# Patient Record
Sex: Female | Born: 1985 | Hispanic: Refuse to answer | Marital: Single
Health system: Northeastern US, Academic
[De-identification: ages and names within clinical notes are randomized; demographics above are authoritative.]

## PROBLEM LIST (undated history)

## (undated) ENCOUNTER — Inpatient Hospital Stay (HOSPITAL_COMMUNITY): Payer: Self-pay

## (undated) HISTORY — PX: TONSILLECTOMY: SUR1361

## (undated) HISTORY — PX: BREAST SURGERY: SHX581

---
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
## 2010-11-19 ENCOUNTER — Inpatient Hospital Stay (HOSPITAL_COMMUNITY)
Admission: AD | Admit: 2010-11-19 | Discharge: 2010-11-20 | Payer: Self-pay | Source: Home / Self Care | Attending: Obstetrics and Gynecology | Admitting: Obstetrics and Gynecology

## 2011-02-11 LAB — CBC
HCT: 24.6 % — ABNORMAL LOW (ref 36.0–46.0)
HCT: 32.2 % — ABNORMAL LOW (ref 36.0–46.0)
Hemoglobin: 10.7 g/dL — ABNORMAL LOW (ref 12.0–15.0)
Hemoglobin: 8.3 g/dL — ABNORMAL LOW (ref 12.0–15.0)
MCH: 29.6 pg (ref 26.0–34.0)
RBC: 2.77 MIL/uL — ABNORMAL LOW (ref 3.87–5.11)
RBC: 3.61 MIL/uL — ABNORMAL LOW (ref 3.87–5.11)

## 2011-02-11 LAB — RPR: RPR Ser Ql: NONREACTIVE

## 2011-02-11 LAB — CCBB MATERNAL DONOR DRAW

## 2011-04-19 NOTE — Op Note (Signed)
   [name], Beth Lowe                       ACCOUNT NO.:  [id number]   MEDICAL RECORD NO.:  [id number]                   PATIENT TYPE:  AMB   LOCATION:  DSC                                  FACILITY:  [facility]   PHYSICIAN:  Kathy Breach, M.D.                   DATE OF BIRTH:  1986-12-01   DATE OF PROCEDURE:  11/22/2002  DATE OF DISCHARGE:                                 OPERATIVE REPORT   PREOPERATIVE DIAGNOSIS:  Chronic recurring adenotonsillitis.   POSTOPERATIVE DIAGNOSIS:  Chronic recurring adenotonsillitis.   OPERATION:  Adenotonsillectomy.   DESCRIPTION OF PROCEDURE:  With the patient under general orotracheal  anesthesia, the Crowe-Davis mouth gag was inserted and the patient was  placed in the Rose position. The tonsils were 1 to 2+ enlarged and deeply  cryptic. The soft palate was normal to configuration. The hard palate was  intact to palpation. The tonsils were not pulsatile to palpation.   The red rubber catheter was passed through the left nasal chamber and was  used to elevate the soft palate. Moderate nasopharyngeal adenoid tissue was  present. The adenoids were removed by curretage. Packs were placed for  hemostasis.   The left tonsil was grasped at the superior pole, but the tonsil was  extremely friable and difficult to dissect but with electrodissection was  dissected from the fossa, maintaining complete hemostasis with  electrocautery. The right tonsil was removed in a similar fashion.   The packs were removed from the nasopharynx, and under mirrored  visualization, ablation of the remaining adenoidal fragments in the  posterior choana and the Rosenmuller's fossa areas was ablated, as well as  obtaining complete hemostasis at the adenoidectomy site. Blood loss from the  procedure was estimated at 150 cc.   The patient tolerated the procedure well. She was transferred to the  recovery room in stable general condition.                 Kathy Breach, M.D.    Venia Minks  D:  11/22/2002  T:  11/22/2002  Job:  [id number]

## 2013-11-03 ENCOUNTER — Inpatient Hospital Stay (HOSPITAL_COMMUNITY)
Admission: AD | Admit: 2013-11-03 | Discharge: 2013-11-04 | Disposition: A | Discharge status: Home / Self Care | Payer: Medicaid Other | Source: Ambulatory Visit | Attending: Obstetrics and Gynecology | Admitting: Obstetrics and Gynecology

## 2013-11-03 DIAGNOSIS — O418X10 Other specified disorders of amniotic fluid and membranes, first trimester, not applicable or unspecified: Secondary | ICD-10-CM

## 2013-11-03 DIAGNOSIS — R109 Unspecified abdominal pain: Secondary | ICD-10-CM | POA: Insufficient documentation

## 2013-11-03 DIAGNOSIS — O208 Other hemorrhage in early pregnancy: Secondary | ICD-10-CM | POA: Insufficient documentation

## 2013-11-03 DIAGNOSIS — O2620 Pregnancy care for patient with recurrent pregnancy loss, unspecified trimester: Secondary | ICD-10-CM | POA: Insufficient documentation

## 2013-11-03 DIAGNOSIS — O2621 Pregnancy care for patient with recurrent pregnancy loss, first trimester: Secondary | ICD-10-CM

## 2013-11-03 DIAGNOSIS — O2390 Unspecified genitourinary tract infection in pregnancy, unspecified trimester: Secondary | ICD-10-CM | POA: Insufficient documentation

## 2013-11-03 DIAGNOSIS — B373 Candidiasis of vulva and vagina: Secondary | ICD-10-CM | POA: Insufficient documentation

## 2013-11-03 DIAGNOSIS — B3731 Acute candidiasis of vulva and vagina: Secondary | ICD-10-CM | POA: Insufficient documentation

## 2013-11-04 ENCOUNTER — Encounter (HOSPITAL_COMMUNITY): Payer: Self-pay

## 2013-11-04 ENCOUNTER — Inpatient Hospital Stay (HOSPITAL_COMMUNITY): Payer: Medicaid Other

## 2013-11-04 DIAGNOSIS — O468X9 Other antepartum hemorrhage, unspecified trimester: Secondary | ICD-10-CM

## 2013-11-04 LAB — WET PREP, GENITAL
Trich, Wet Prep: NONE SEEN
Yeast Wet Prep HPF POC: NONE SEEN

## 2013-11-04 LAB — URINALYSIS, ROUTINE W REFLEX MICROSCOPIC
Glucose, UA: NEGATIVE mg/dL
Leukocytes, UA: NEGATIVE
pH: 6.5 (ref 5.0–8.0)

## 2013-11-04 MED ORDER — TERCONAZOLE 0.4 % VA CREA: 1.0000 | TOPICAL_CREAM | Freq: Every day | VAGINAL | Status: DC

## 2013-11-04 NOTE — MAU Provider Note (Signed)
Chief Complaint: Abdominal Pain and Vaginal Bleeding   First Provider Initiated Contact with Patient 11/04/13 0028      SUBJECTIVE HPI: Beth Lowe is a 27 y.o. G3P2002 at 8.4 by LMP who presents from [facility] with low abd pain and scant spotting for R/O ectopic pregnancy. Quant 200,000. Pelvic done, but unsure if cultures done. [place] not avail. See note scanned under media tab. O Negative.  Denies fever, chills, vaginal discharge, GI complaints, urinary complaints  History reviewed. No pertinent past medical history. OB History  Gravida Para Term Preterm AB SAB TAB Ectopic Multiple Living  3 2 2       2     # Outcome Date GA Lbr Len/2nd Weight Sex Delivery Anes PTL Lv  3 CUR           2 TRM      SVD   Y  1 TRM      SVD   Y     Past Surgical History  Procedure Laterality Date  . Tonsillectomy    . Breast surgery     History   Social History  . Marital Status: Single    Spouse Name: N/A    Number of Children: N/A  . Years of Education: N/A   Occupational History  . Not on file.   Social History Main Topics  . Smoking status: Never Smoker   . Smokeless tobacco: Never Used  . Alcohol Use: No  . Drug Use: No  . Sexual Activity: Yes   Other Topics Concern  . Not on file   Social History Narrative  . No narrative on file   No current facility-administered medications on file prior to encounter.   No current outpatient prescriptions on file prior to encounter.   No Known Allergies  ROS: Pertinent items in HPI  OBJECTIVE Blood pressure 123/72, pulse 79, temperature 98.7 F (37.1 C), temperature source Oral, resp. rate 18, height 5\' 9"  (1.753 m), weight 69.4 kg (153 lb), last menstrual period 09/05/2013, SpO2 100.00%. GENERAL: Well-developed, well-nourished female in no acute distress.  HEENT: Normocephalic HEART: normal rate RESP: normal effort ABDOMEN: Soft, non-tender EXTREMITIES: Nontender, no edema NEURO: Alert and oriented SPECULUM EXAM:  NEFG, light tan discharge, normal odor. scant, cervix clean BIMANUAL: cervix closed; uterus 8-week size, no adnexal tenderness or masses. No cervical motion tenderness.  LAB RESULTS Results for orders placed during the hospital encounter of 11/03/13 (from the past 24 hour(s))  URINALYSIS, ROUTINE W REFLEX MICROSCOPIC     Status: None   Collection Time    11/04/13 12:10 AM      Result Value Range   Color, Urine YELLOW  YELLOW   APPearance CLEAR  CLEAR   Specific Gravity, Urine 1.020  1.005 - 1.030   pH 6.5  5.0 - 8.0   Glucose, UA NEGATIVE  NEGATIVE mg/dL   Hgb urine dipstick NEGATIVE  NEGATIVE   Bilirubin Urine NEGATIVE  NEGATIVE   Ketones, ur NEGATIVE  NEGATIVE mg/dL   Protein, ur NEGATIVE  NEGATIVE mg/dL   Urobilinogen, UA 0.2  0.0 - 1.0 mg/dL   Nitrite NEGATIVE  NEGATIVE   Leukocytes, UA NEGATIVE  NEGATIVE  WET PREP, GENITAL     Status: Abnormal   Collection Time    11/04/13  2:10 AM      Result Value Range   Yeast Wet Prep HPF POC NONE SEEN  NONE SEEN   Trich, Wet Prep NONE SEEN  NONE SEEN   Clue Cells  Wet Prep HPF POC FEW (*) NONE SEEN   WBC, Wet Prep HPF POC MODERATE (*) NONE SEEN    IMAGING US Ob Comp Less 14 Wks  11/04/2013   CLINICAL DATA:  Right lower quadrant pain and spotting. Eight weeks 4 days pregnant by last menstrual period.  EXAM: OBSTETRIC <14 WK ULTRASOUND  TECHNIQUE: Transabdominal ultrasound was performed for evaluation of the gestation as well as the maternal uterus and adnexal regions.  COMPARISON:  04/18/2010  FINDINGS: Intrauterine gestational sac: Visualized/normal in shape.  Yolk sac:  Present  Embryo:  Present  Cardiac Activity: Present  Heart Rate: 165 bpm  CRL:   21  mm   8 w 5 d                  [place] EDC: 06/11/2014  Maternal uterus/adnexae: Right ovary not visualized. 2.6 cm left ovarian lesion with peripheral hyperemia is suspicious for a corpus luteal cyst  Tiny subchorionic hemorrhage is identified, including on image 2.  No significant free fluid.   IMPRESSION: 1. Intrauterine pregnancy of 8 weeks 5 days with fetal heart rate of 165 beats per min. 2. Tiny subchorionic hemorrhage. 3. Left ovarian corpus luteal cyst.   Electronically Signed   By: Jeronimo Greaves M.D.   On: 11/04/2013 01:04    MAU COURSE  ASSESSMENT 1. Subchorionic hemorrhage in first trimester . Live single intrauterine pregnancy, size equals dates.   2. Three previous miscarriages, affecting care of mother, antepartum, first trimester   3. Vulvovaginal candidiasis    PLAN Discharge home in stable condition. Bleeding precautions. Pelvic rest x1 week. Rhophylac deferred due to almost imperceptible bleeding. Follow-up Information   Follow up with [facility] (Start prenatal care)    Contact information:   [address] [place] [address] [phone]      Follow up with [facility]. (As needed for OB/GYN emergencies)    Contact information:   [address] [id number] [place] [address] [phone]       Medication List         multivitamin-prenatal 27-0.8 MG Tabs tablet  Take 1 tablet by mouth daily at 12 noon.     terconazole 0.4 % vaginal cream  Commonly known as:  TERAZOL 7  Place 1 applicator vaginally at bedtime.       [place], CNM 11/04/2013  2:51 AM

## 2013-11-04 NOTE — MAU Note (Signed)
Pt states right lower abdominal pain and spotting began about 2 weeks ago. Pt was transferred via private vehicle from [facility] for ultrasound. Pt brought medical records.

## 2013-11-05 NOTE — MAU Provider Note (Signed)
Attestation of Attending Supervision of Advanced Practitioner: Evaluation and management procedures were performed by the PA/NP/CNM/OB Fellow under my supervision/collaboration. Chart reviewed and agree with management and plan.  Deardra Hinkley V 11/05/2013 10:31 PM

## 2013-12-02 NOTE — L&D Delivery Note (Signed)
Patient was C/C/+3 and pushed for 2 minutes with epidural.   NSVD  female infant, Apgars 8,9, weight P.   A less than 30 second shoulder dystocia was resolved with McRoberts and suprapubic pressure. The patient had one small second degree R labial laceration repaired with 3-0 vicryl R. Fundus was firm. EBL was expected. Placenta was delivered intact. Vagina was clear.  Baby was vigorous and doing skin to skin with mother and moving both arms actively.  Royale Swamy A

## 2014-05-13 LAB — OB RESULTS CONSOLE GBS: STREP GROUP B AG: NEGATIVE

## 2014-05-25 ENCOUNTER — Encounter (HOSPITAL_COMMUNITY): Payer: Self-pay | Admitting: *Deleted

## 2014-05-25 ENCOUNTER — Inpatient Hospital Stay (HOSPITAL_COMMUNITY)
Admission: AD | Admit: 2014-05-25 | Discharge: 2014-05-25 | Disposition: A | Discharge status: Home / Self Care | Payer: Medicaid Other | Source: Ambulatory Visit | Attending: Obstetrics and Gynecology | Admitting: Obstetrics and Gynecology

## 2014-05-25 DIAGNOSIS — O479 False labor, unspecified: Secondary | ICD-10-CM | POA: Insufficient documentation

## 2014-05-25 MED ORDER — ZOLPIDEM TARTRATE 5 MG PO TABS
5.0000 mg | ORAL_TABLET | Freq: Once | ORAL | Status: AC
  Administered 2014-05-25: 5 mg via ORAL
  Filled 2014-05-25: qty 1

## 2014-05-25 NOTE — MAU Note (Signed)
PT SAYS SHE STARTED HURTING BAD A T  2300.   VE IN OFFICE YESTERDAY   2 CM.  DENIES HSV AND  MRSA.Marland Kitchen.  GBS-  NEG

## 2014-06-08 ENCOUNTER — Encounter (HOSPITAL_COMMUNITY): Payer: Medicaid Other | Admitting: Anesthesiology

## 2014-06-08 ENCOUNTER — Inpatient Hospital Stay (HOSPITAL_COMMUNITY): Payer: Medicaid Other | Admitting: Anesthesiology

## 2014-06-08 ENCOUNTER — Inpatient Hospital Stay (HOSPITAL_COMMUNITY)
Admission: AD | Admit: 2014-06-08 | Discharge: 2014-06-10 | DRG: 775 | Disposition: A | Discharge status: Home / Self Care | Payer: Medicaid Other | Source: Ambulatory Visit | Attending: Obstetrics and Gynecology | Admitting: Obstetrics and Gynecology

## 2014-06-08 ENCOUNTER — Encounter (HOSPITAL_COMMUNITY): Payer: Self-pay | Admitting: *Deleted

## 2014-06-08 DIAGNOSIS — O479 False labor, unspecified: Secondary | ICD-10-CM | POA: Diagnosis present

## 2014-06-08 DIAGNOSIS — IMO0001 Reserved for inherently not codable concepts without codable children: Secondary | ICD-10-CM

## 2014-06-08 LAB — OB RESULTS CONSOLE GC/CHLAMYDIA
Chlamydia: NEGATIVE
Gonorrhea: NEGATIVE

## 2014-06-08 LAB — OB RESULTS CONSOLE ANTIBODY SCREEN: Antibody Screen: NEGATIVE

## 2014-06-08 LAB — CBC
HEMATOCRIT: 33.2 % — AB (ref 36.0–46.0)
Hemoglobin: 11 g/dL — ABNORMAL LOW (ref 12.0–15.0)
MCH: 28.9 pg (ref 26.0–34.0)
MCHC: 33.1 g/dL (ref 30.0–36.0)
MCV: 87.4 fL (ref 78.0–100.0)
PLATELETS: 213 10*3/uL (ref 150–400)
RBC: 3.8 MIL/uL — ABNORMAL LOW (ref 3.87–5.11)
RDW: 13.3 % (ref 11.5–15.5)
WBC: 9 10*3/uL (ref 4.0–10.5)

## 2014-06-08 LAB — OB RESULTS CONSOLE ABO/RH: RH TYPE: NEGATIVE

## 2014-06-08 LAB — OB RESULTS CONSOLE HIV ANTIBODY (ROUTINE TESTING): HIV: NONREACTIVE

## 2014-06-08 LAB — OB RESULTS CONSOLE RUBELLA ANTIBODY, IGM: Rubella: IMMUNE

## 2014-06-08 LAB — OB RESULTS CONSOLE HEPATITIS B SURFACE ANTIGEN: Hepatitis B Surface Ag: NEGATIVE

## 2014-06-08 LAB — OB RESULTS CONSOLE RPR: RPR: NONREACTIVE

## 2014-06-08 MED ORDER — FLEET ENEMA 7-19 GM/118ML RE ENEM: 1.0000 | ENEMA | RECTAL | Status: DC | PRN

## 2014-06-08 MED ORDER — ONDANSETRON HCL 4 MG/2ML IJ SOLN: 4.0000 mg | Freq: Four times a day (QID) | INTRAMUSCULAR | Status: DC | PRN

## 2014-06-08 MED ORDER — OXYTOCIN 40 UNITS IN LACTATED RINGERS INFUSION - SIMPLE MED
62.5000 mL/h | INTRAVENOUS | Status: DC
  Filled 2014-06-08 (×2): qty 1000

## 2014-06-08 MED ORDER — LACTATED RINGERS IV SOLN: 500.0000 mL | INTRAVENOUS | Status: DC | PRN

## 2014-06-08 MED ORDER — CITRIC ACID-SODIUM CITRATE 334-500 MG/5ML PO SOLN: 30.0000 mL | ORAL | Status: DC | PRN

## 2014-06-08 MED ORDER — LIDOCAINE HCL (PF) 1 % IJ SOLN
30.0000 mL | INTRAMUSCULAR | Status: DC | PRN
  Filled 2014-06-08: qty 30

## 2014-06-08 MED ORDER — FENTANYL 2.5 MCG/ML BUPIVACAINE 1/10 % EPIDURAL INFUSION (WH - ANES)
INTRAMUSCULAR | Status: DC | PRN
  Administered 2014-06-08: 14 mL/h via EPIDURAL

## 2014-06-08 MED ORDER — PHENYLEPHRINE 40 MCG/ML (10ML) SYRINGE FOR IV PUSH (FOR BLOOD PRESSURE SUPPORT)
80.0000 ug | PREFILLED_SYRINGE | INTRAVENOUS | Status: DC | PRN
  Filled 2014-06-08: qty 2

## 2014-06-08 MED ORDER — IBUPROFEN 600 MG PO TABS: 600.0000 mg | ORAL_TABLET | Freq: Four times a day (QID) | ORAL | Status: DC | PRN

## 2014-06-08 MED ORDER — FENTANYL 2.5 MCG/ML BUPIVACAINE 1/10 % EPIDURAL INFUSION (WH - ANES)
14.0000 mL/h | INTRAMUSCULAR | Status: DC | PRN
Start: 2014-06-08 — End: 2014-06-09
  Administered 2014-06-08: 14 mL/h via EPIDURAL
  Filled 2014-06-08: qty 125

## 2014-06-08 MED ORDER — LACTATED RINGERS IV SOLN
INTRAVENOUS | Status: DC
  Administered 2014-06-08: 17:00:00 via INTRAVENOUS

## 2014-06-08 MED ORDER — OXYCODONE-ACETAMINOPHEN 5-325 MG PO TABS: 1.0000 | ORAL_TABLET | ORAL | Status: DC | PRN

## 2014-06-08 MED ORDER — EPHEDRINE 5 MG/ML INJ
10.0000 mg | INTRAVENOUS | Status: DC | PRN
  Filled 2014-06-08: qty 2

## 2014-06-08 MED ORDER — TERBUTALINE SULFATE 1 MG/ML IJ SOLN: 0.2500 mg | Freq: Once | INTRAMUSCULAR | Status: AC | PRN

## 2014-06-08 MED ORDER — ACETAMINOPHEN 325 MG PO TABS: 650.0000 mg | ORAL_TABLET | ORAL | Status: DC | PRN

## 2014-06-08 MED ORDER — PHENYLEPHRINE 40 MCG/ML (10ML) SYRINGE FOR IV PUSH (FOR BLOOD PRESSURE SUPPORT)
80.0000 ug | PREFILLED_SYRINGE | INTRAVENOUS | Status: DC | PRN
  Filled 2014-06-08: qty 2
  Filled 2014-06-08: qty 10

## 2014-06-08 MED ORDER — LACTATED RINGERS IV SOLN
500.0000 mL | Freq: Once | INTRAVENOUS | Status: AC
Start: 2014-06-08 — End: 2014-06-08
  Administered 2014-06-08: 500 mL via INTRAVENOUS

## 2014-06-08 MED ORDER — OXYTOCIN BOLUS FROM INFUSION
500.0000 mL | INTRAVENOUS | Status: DC
  Administered 2014-06-09: 500 mL via INTRAVENOUS

## 2014-06-08 MED ORDER — BUTORPHANOL TARTRATE 1 MG/ML IJ SOLN: 1.0000 mg | INTRAMUSCULAR | Status: DC | PRN

## 2014-06-08 MED ORDER — DIPHENHYDRAMINE HCL 50 MG/ML IJ SOLN: 12.5000 mg | INTRAMUSCULAR | Status: DC | PRN

## 2014-06-08 MED ORDER — OXYTOCIN 40 UNITS IN LACTATED RINGERS INFUSION - SIMPLE MED
1.0000 m[IU]/min | INTRAVENOUS | Status: DC
  Administered 2014-06-08: 1 m[IU]/min via INTRAVENOUS

## 2014-06-08 MED ORDER — LIDOCAINE HCL (PF) 1 % IJ SOLN
INTRAMUSCULAR | Status: DC | PRN
  Administered 2014-06-08: 8 mL
  Administered 2014-06-08: 9 mL

## 2014-06-08 NOTE — MAU Note (Signed)
Patient states she is having contractons every 7-10 minutes. Denies bleeding or leaking and reports good fetal movement.

## 2014-06-08 NOTE — H&P (Signed)
28 y.o. 5898w3d  G3P2002 comes in c/o labor.  Otherwise has good fetal movement and no bleeding.  History reviewed. No pertinent past medical history.  Past Surgical History  Procedure Laterality Date  . Tonsillectomy    . Breast surgery      OB History  Gravida Para Term Preterm AB SAB TAB Ectopic Multiple Living  3 2 2       2     # Outcome Date GA Lbr Len/2nd Weight Sex Delivery Anes PTL Lv  3 CUR           2 TRM      SVD   Y  1 TRM      SVD   Y      History   Social History  . Marital Status: Single    Spouse Name: N/A    Number of Children: N/A  . Years of Education: N/A   Occupational History  . Not on file.   Social History Main Topics  . Smoking status: Never Smoker   . Smokeless tobacco: Never Used  . Alcohol Use: No  . Drug Use: No  . Sexual Activity: Yes   Other Topics Concern  . Not on file   Social History Narrative  . No narrative on file   Review of patient's allergies indicates no known allergies.    Prenatal Transfer Tool  Maternal Diabetes: No Genetic Screening: Normal Maternal Ultrasounds/Referrals: Normal Fetal Ultrasounds or other Referrals:  None Maternal Substance Abuse:  No Significant Maternal Medications:  None Significant Maternal Lab Results: None  Other PNC: uncomplicated.    Filed Vitals:   06/08/14 1508  BP: 116/75  Pulse: 105  Temp: 99 F (37.2 C)  Resp: 16     Lungs/Cor:  NAD Abdomen:  soft, gravid Ex:  no cords, erythema SVE:  5/70/-2 FHTs:  140, good STV, NST R Toco:  q3-4   A/P   Term labor.  GBS neg.  Beth Lowe A

## 2014-06-08 NOTE — Anesthesia Procedure Notes (Signed)
Epidural Patient location during procedure: OB Start time: 06/08/2014 5:55 PM End time: 06/08/2014 5:59 PM  Staffing Anesthesiologist: Leilani AbleHATCHETT, Farzana Koci Performed by: anesthesiologist   Preanesthetic Checklist Completed: patient identified, surgical consent, pre-op evaluation, timeout performed, IV checked, risks and benefits discussed and monitors and equipment checked  Epidural Patient position: sitting Prep: site prepped and draped and DuraPrep Patient monitoring: continuous pulse ox and blood pressure Approach: midline Location: L3-L4 Injection technique: LOR air  Needle:  Needle type: Tuohy  Needle gauge: 17 G Needle length: 9 cm and 9 Needle insertion depth: 5 cm cm Catheter type: closed end flexible Catheter size: 19 Gauge Catheter at skin depth: 10 cm Test dose: negative  Assessment Sensory level: T9 Events: blood not aspirated, injection not painful, no injection resistance, negative IV test and no paresthesia  Additional Notes Reason for block:procedure for pain

## 2014-06-08 NOTE — MAU Note (Signed)
Patient states she was 4-5 cm on Monday.

## 2014-06-08 NOTE — Progress Notes (Signed)
SVE 6/80/-2, AROM Clear  FHTs 130s, g STV, NST R Toco q 2-4

## 2014-06-08 NOTE — Anesthesia Preprocedure Evaluation (Signed)

## 2014-06-09 ENCOUNTER — Encounter (HOSPITAL_COMMUNITY): Payer: Self-pay | Admitting: *Deleted

## 2014-06-09 LAB — CBC
HCT: 29.7 % — ABNORMAL LOW (ref 36.0–46.0)
Hemoglobin: 9.7 g/dL — ABNORMAL LOW (ref 12.0–15.0)
MCH: 28.6 pg (ref 26.0–34.0)
MCHC: 32.7 g/dL (ref 30.0–36.0)
MCV: 87.6 fL (ref 78.0–100.0)
PLATELETS: 184 10*3/uL (ref 150–400)
RBC: 3.39 MIL/uL — AB (ref 3.87–5.11)
RDW: 13.3 % (ref 11.5–15.5)
WBC: 11.3 10*3/uL — ABNORMAL HIGH (ref 4.0–10.5)

## 2014-06-09 LAB — RPR

## 2014-06-09 MED ORDER — SODIUM CHLORIDE 0.9 % IV SOLN: 250.0000 mL | INTRAVENOUS | Status: DC | PRN

## 2014-06-09 MED ORDER — METHYLERGONOVINE MALEATE 0.2 MG/ML IJ SOLN: 0.2000 mg | INTRAMUSCULAR | Status: DC | PRN

## 2014-06-09 MED ORDER — DIBUCAINE 1 % RE OINT: 1.0000 "application " | TOPICAL_OINTMENT | RECTAL | Status: DC | PRN

## 2014-06-09 MED ORDER — FERROUS SULFATE 325 (65 FE) MG PO TABS
325.0000 mg | ORAL_TABLET | Freq: Two times a day (BID) | ORAL | Status: DC
  Administered 2014-06-09 – 2014-06-10 (×2): 325 mg via ORAL
  Filled 2014-06-09 (×2): qty 1

## 2014-06-09 MED ORDER — OXYCODONE-ACETAMINOPHEN 5-325 MG PO TABS: 1.0000 | ORAL_TABLET | ORAL | Status: DC | PRN

## 2014-06-09 MED ORDER — IBUPROFEN 800 MG PO TABS
800.0000 mg | ORAL_TABLET | Freq: Three times a day (TID) | ORAL | Status: DC
  Administered 2014-06-09 (×3): 800 mg via ORAL
  Filled 2014-06-09 (×4): qty 1

## 2014-06-09 MED ORDER — TETANUS-DIPHTH-ACELL PERTUSSIS 5-2.5-18.5 LF-MCG/0.5 IM SUSP: 0.5000 mL | Freq: Once | INTRAMUSCULAR | Status: DC

## 2014-06-09 MED ORDER — PRENATAL MULTIVITAMIN CH
1.0000 | ORAL_TABLET | Freq: Every day | ORAL | Status: DC
  Administered 2014-06-09: 1 via ORAL
  Filled 2014-06-09: qty 1

## 2014-06-09 MED ORDER — MEASLES, MUMPS & RUBELLA VAC ~~LOC~~ INJ
0.5000 mL | INJECTION | Freq: Once | SUBCUTANEOUS | Status: DC
  Filled 2014-06-09: qty 0.5

## 2014-06-09 MED ORDER — LANOLIN HYDROUS EX OINT: TOPICAL_OINTMENT | CUTANEOUS | Status: DC | PRN

## 2014-06-09 MED ORDER — ONDANSETRON HCL 4 MG PO TABS
4.0000 mg | ORAL_TABLET | ORAL | Status: DC | PRN
Start: 2014-06-09 — End: 2014-06-10

## 2014-06-09 MED ORDER — SENNOSIDES-DOCUSATE SODIUM 8.6-50 MG PO TABS
2.0000 | ORAL_TABLET | ORAL | Status: DC
  Administered 2014-06-09: 2 via ORAL
  Filled 2014-06-09: qty 2

## 2014-06-09 MED ORDER — MAGNESIUM HYDROXIDE 400 MG/5ML PO SUSP: 30.0000 mL | ORAL | Status: DC | PRN

## 2014-06-09 MED ORDER — SIMETHICONE 80 MG PO CHEW: 80.0000 mg | CHEWABLE_TABLET | ORAL | Status: DC | PRN

## 2014-06-09 MED ORDER — BENZOCAINE-MENTHOL 20-0.5 % EX AERO
1.0000 "application " | INHALATION_SPRAY | CUTANEOUS | Status: DC | PRN
  Administered 2014-06-09: 1 via TOPICAL
  Filled 2014-06-09: qty 56

## 2014-06-09 MED ORDER — WITCH HAZEL-GLYCERIN EX PADS
1.0000 | MEDICATED_PAD | CUTANEOUS | Status: DC | PRN
Start: 2014-06-09 — End: 2014-06-10

## 2014-06-09 MED ORDER — SODIUM CHLORIDE 0.9 % IJ SOLN: 3.0000 mL | INTRAMUSCULAR | Status: DC | PRN

## 2014-06-09 MED ORDER — ONDANSETRON HCL 4 MG/2ML IJ SOLN: 4.0000 mg | INTRAMUSCULAR | Status: DC | PRN

## 2014-06-09 MED ORDER — SODIUM CHLORIDE 0.9 % IJ SOLN: 3.0000 mL | Freq: Two times a day (BID) | INTRAMUSCULAR | Status: DC

## 2014-06-09 MED ORDER — METHYLERGONOVINE MALEATE 0.2 MG PO TABS: 0.2000 mg | ORAL_TABLET | ORAL | Status: DC | PRN

## 2014-06-09 MED ORDER — ZOLPIDEM TARTRATE 5 MG PO TABS: 5.0000 mg | ORAL_TABLET | Freq: Every evening | ORAL | Status: DC | PRN

## 2014-06-09 MED ORDER — FAMOTIDINE 20 MG PO TABS
10.0000 mg | ORAL_TABLET | Freq: Two times a day (BID) | ORAL | Status: DC
  Filled 2014-06-09 (×2): qty 1

## 2014-06-09 MED ORDER — DIPHENHYDRAMINE HCL 25 MG PO CAPS: 25.0000 mg | ORAL_CAPSULE | Freq: Four times a day (QID) | ORAL | Status: DC | PRN

## 2014-06-09 NOTE — Lactation Note (Signed)
This note was copied from the chart of Beth Lowe. Lactation Consultation Note    Initial consult with this mom and baby, now 4512 hours old, and full term. Baby has breic breast feeding teaching done from the Baby and Me booklet, and lactation and community services reviewed. Mom knows to call for questions/concerns. She is an experienced breat feeder, and denies needing assistance at this time.  Patient Name: Beth Beth Lowe ZOXWR'UToday's Date: 06/09/2014 Reason for consult: Initial assessment   Maternal Data Formula Feeding for Exclusion: No Infant to breast within first hour of birth: Yes Has patient been taught Hand Expression?: Yes Does the patient have breastfeeding experience prior to this delivery?: Yes  Feeding    LATCH Score/Interventions    Audible Swallowing:  (easily expressed colostrum)  Type of Nipple: Everted at rest and after stimulation  Comfort (Breast/Nipple): Soft / non-tender (breast implants since last baby)           Lactation Tools Discussed/Used     Consult Status Consult Status: Follow-up Date: 06/10/14 Follow-up type: In-patient    Beth Lowe, Beth Lowe 06/09/2014, 1:47 PM

## 2014-06-09 NOTE — Progress Notes (Signed)
UR chart review completed.  

## 2014-06-09 NOTE — Anesthesia Postprocedure Evaluation (Signed)
Anesthesia Post Note  Patient: Beth Lowe  Procedure(s) Performed: * No procedures listed *  Anesthesia type: Epidural  Patient location: Mother/Baby  Post pain: Pain level controlled  Post assessment: Post-op Vital signs reviewed  Last Vitals:  Filed Vitals:   06/09/14 0438  BP: 108/66  Pulse: 89  Temp: 36.7 C  Resp: 16    Post vital signs: Reviewed  Level of consciousness:alert  Complications: No apparent anesthesia complications

## 2014-06-10 MED ORDER — IBUPROFEN 800 MG PO TABS: 800.0000 mg | ORAL_TABLET | Freq: Three times a day (TID) | ORAL | Status: DC | PRN

## 2014-06-10 NOTE — Discharge Instructions (Signed)
Vaginal Delivery °Care After °Refer to this sheet in the next few weeks. These discharge instructions provide you with information on caring for yourself after delivery. Your caregiver may also give you specific instructions. Your treatment has been planned according to the most current medical practices available, but problems sometimes occur. Call your caregiver if you have any problems or questions after you go home. °HOME CARE INSTRUCTIONS °· Take over-the-counter or prescription medicines only as directed by your caregiver or pharmacist. °· Do not drink alcohol, especially if you are breastfeeding or taking medicine to relieve pain. °· Do not chew or smoke tobacco. °· Do not use illegal drugs. °· Continue to use good perineal care. Good perineal care includes: °¨ Wiping your perineum from front to back. °¨ Keeping your perineum clean. °· Do not use tampons or douche until your caregiver says it is okay. °· Shower, wash your hair, and take tub baths as directed by your caregiver. °· Wear a well-fitting bra that provides breast support. °· Eat healthy foods. °· Drink enough fluids to keep your urine clear or pale yellow. °· Eat high-fiber foods such as whole grain cereals and breads, brown rice, beans, and fresh fruits and vegetables every day. These foods may help prevent or relieve constipation. °· Follow your cargiver's recommendations regarding resumption of activities such as climbing stairs, driving, lifting, exercising, or traveling. °· Talk to your caregiver about resuming sexual activities. Resumption of sexual activities is dependent upon your risk of infection, your rate of healing, and your comfort and desire to resume sexual activity. °· Try to have someone help you with your household activities and your newborn for at least a few days after you leave the hospital. °· Rest as much as possible. Try to rest or take a nap when your newborn is sleeping. °· Increase your activities gradually. °· Keep all  of your scheduled postpartum appointments. It is very important to keep your scheduled follow-up appointments. At these appointments, your caregiver will be checking to make sure that you are healing physically and emotionally. °SEEK MEDICAL CARE IF:  °· You are passing large clots from your vagina. Save any clots to show your caregiver. °· You have a foul smelling discharge from your vagina. °· You have trouble urinating. °· You are urinating frequently. °· You have pain when you urinate. °· You have a change in your bowel movements. °· You have increasing redness, pain, or swelling near your vaginal incision (episiotomy) or vaginal tear. °· You have pus draining from your episiotomy or vaginal tear. °· Your episiotomy or vaginal tear is separating. °· You have painful, hard, or reddened breasts. °· You have a severe headache. °· You have blurred vision or see spots. °· You feel sad or depressed. °· You have thoughts of hurting yourself or your newborn. °· You have questions about your care, the care of your newborn, or medicines. °· You are dizzy or lightheaded. °· You have a rash. °· You have nausea or vomiting. °· You were breastfeeding and have not had a menstrual period within 12 weeks after you stopped breastfeeding. °· You are not breastfeeding and have not had a menstrual period by the 12th week after delivery. °· You have a fever. °SEEK IMMEDIATE MEDICAL CARE IF:  °· You have persistent pain. °· You have chest pain. °· You have shortness of breath. °· You faint. °· You have leg pain. °· You have stomach pain. °· Your vaginal bleeding saturates two or more sanitary pads   in 1 hour. °MAKE SURE YOU:  °· Understand these instructions. °· Will watch your condition. °· Will get help right away if you are not doing well or get worse. ° ° °Document Released: 11/15/2000 Document Revised: 08/12/2012 Document Reviewed: 07/15/2012 °ExitCare® Patient Information ©2015 [employer]. This information is not intended to  replace advice given to you by your health care provider. Make sure you discuss any questions you have with your health care provider. ° °

## 2014-06-10 NOTE — Discharge Summary (Signed)
Obstetric Discharge Summary Reason for Admission: onset of labor Prenatal Procedures: none Intrapartum Procedures: spontaneous vaginal delivery Postpartum Procedures: none Complications-Operative and Postpartum: none Hemoglobin  Date Value Ref Range Status  06/09/2014 9.7* 12.0 - 15.0 g/dL Final     HCT  Date Value Ref Range Status  06/09/2014 29.7* 36.0 - 46.0 % Final    Physical Exam:  General: alert, cooperative and no distress Lochia: appropriate Uterine Fundus: firm DVT Evaluation: No evidence of DVT seen on physical exam. Negative Homan's sign. No cords or calf tenderness.  Discharge Diagnoses: Term Pregnancy-delivered  Discharge Information: Date: 06/10/2014 Activity: pelvic rest Diet: routine Medications: PNV and Ibuprofen Condition: stable Instructions: refer to practice specific booklet Discharge to: home   Newborn Data: Live born female  Birth Weight: 9 lb 6.4 oz (4265 g) APGAR: 8, 9  Home with mother.  Essie HartINN, Gurneet Matarese STACIA 06/10/2014, 8:59 AM

## 2014-06-10 NOTE — Progress Notes (Signed)
Post Partum Day 1 Subjective: no complaints, up ad lib, voiding, tolerating PO and + flatus  Objective: Blood pressure 107/69, pulse 69, temperature 97.9 F (36.6 C), temperature source Oral, resp. rate 12, weight 84.369 kg (186 lb), last menstrual period 09/05/2013, SpO2 98.00%, unknown if currently breastfeeding.  Physical Exam:  General: alert, cooperative and no distress Lochia: appropriate Uterine Fundus: firm DVT Evaluation: No evidence of DVT seen on physical exam. Negative Homan's sign. No cords or calf tenderness.   Recent Labs  06/08/14 1705 06/09/14 0610  HGB 11.0* 9.7*  HCT 33.2* 29.7*    Assessment/Plan: Discharge home, Breastfeeding and Contraception will discuss at post partum visit   LOS: 2 days   Karlyn Glasco STACIA 06/10/2014, 8:55 AM

## 2014-10-03 ENCOUNTER — Encounter (HOSPITAL_COMMUNITY): Payer: Self-pay | Admitting: *Deleted

## 2014-10-07 IMAGING — US US OB COMP LESS 14 WK
1 series · 14 of 28 positions shown · non-contrast
Comparison: 04/18/2010

CLINICAL DATA: Right lower quadrant pain and spotting. Eight weeks
4 days pregnant by last menstrual period.

EXAM:
OBSTETRIC <14 WK ULTRASOUND
TECHNIQUE: Transabdominal ultrasound was performed for evaluation of the
gestation as well as the maternal uterus and adnexal regions.

[Series 1: us ob comp less 14 wks · 14 of 32 slices shown]
[im 2/32]
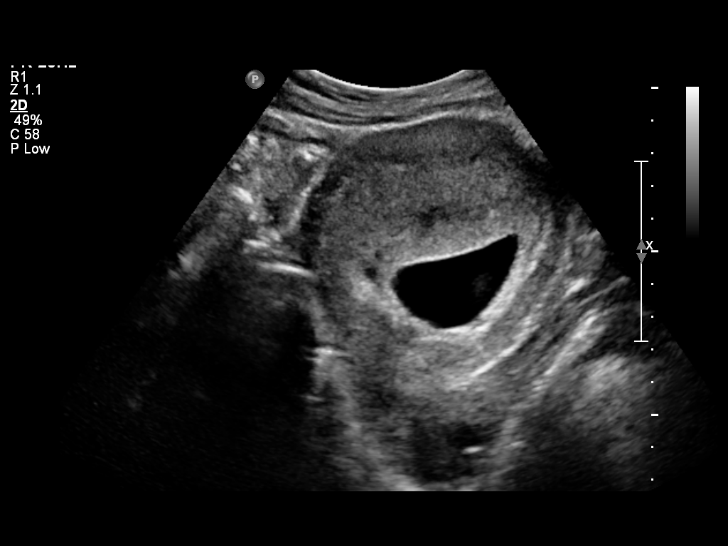
[im 4/32]
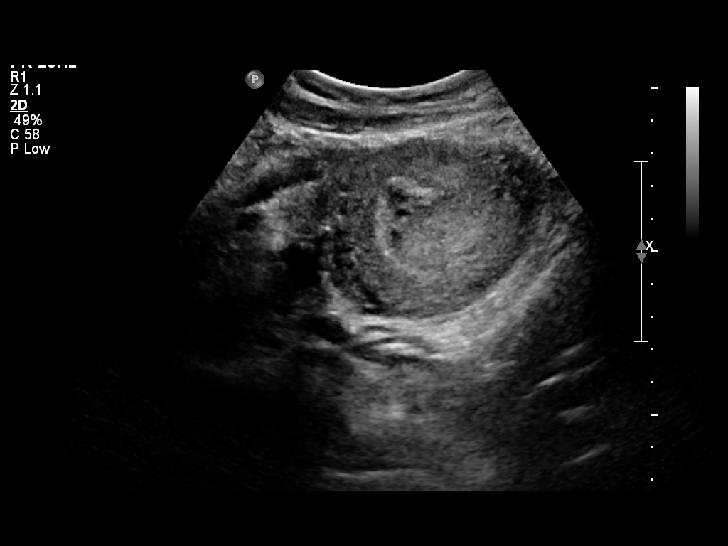
[im 6/32]
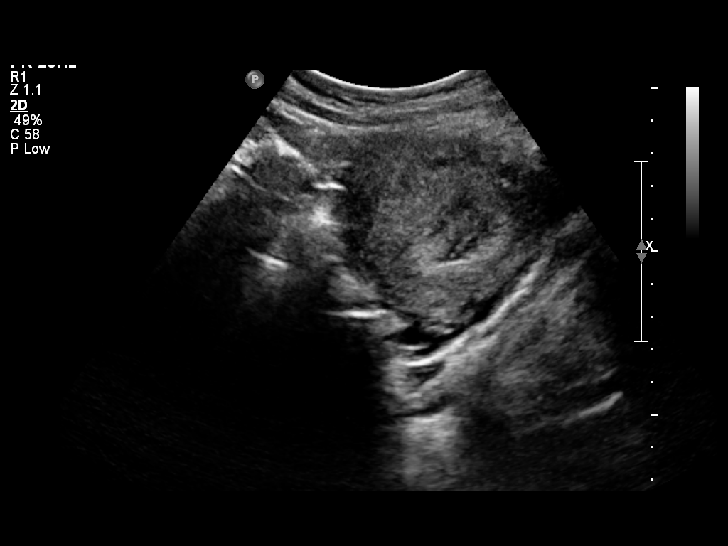
[im 9/32]
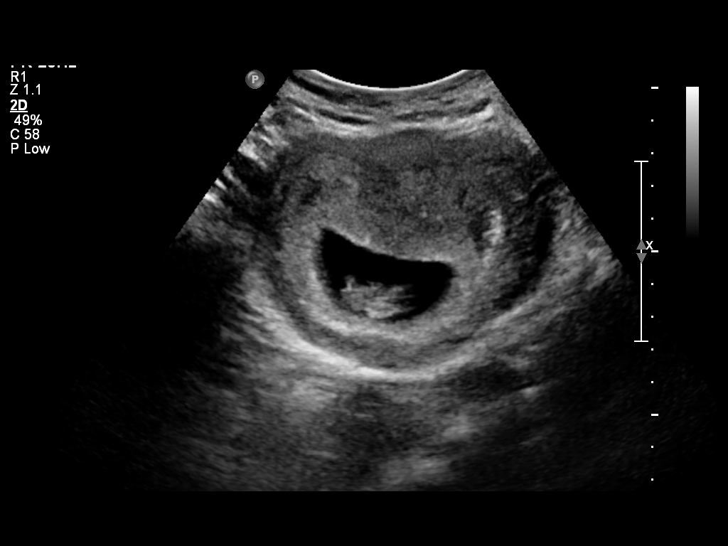
[im 11/32]
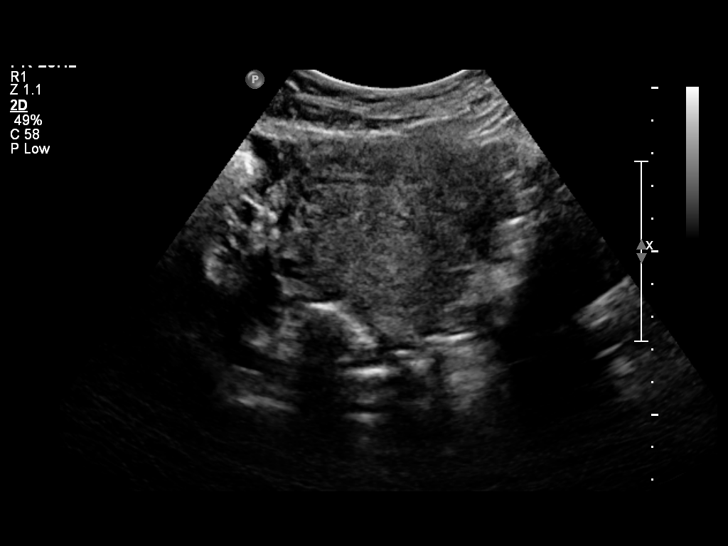
[im 13/32]
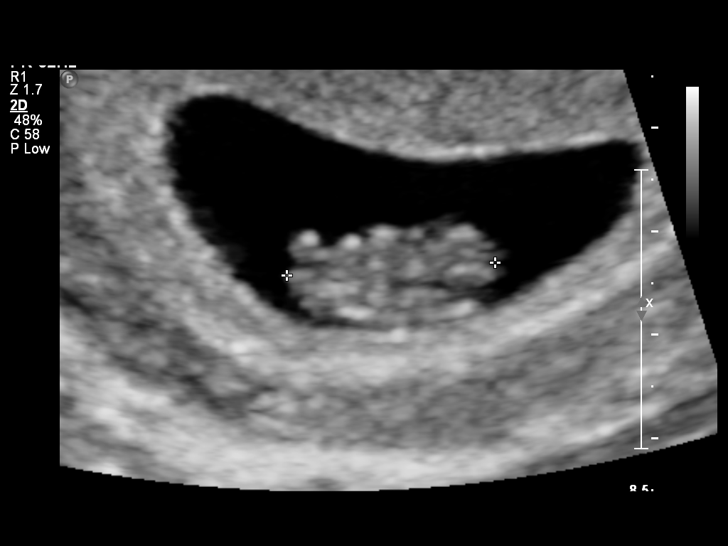
[im 15/32]
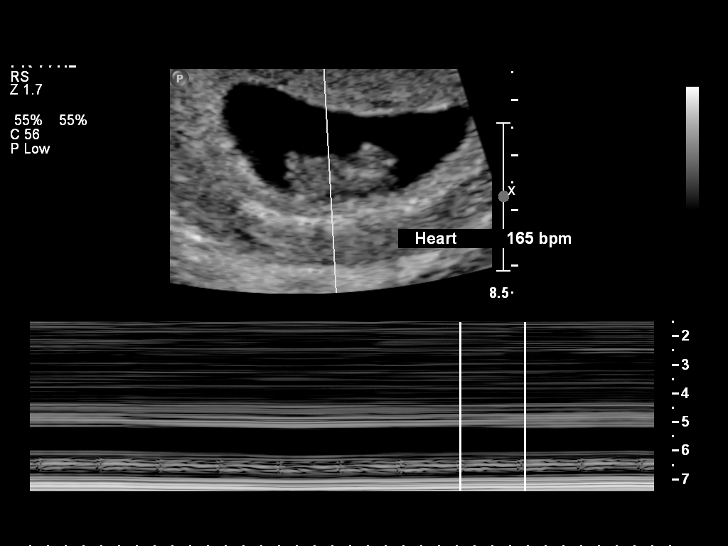
[im 18/32]
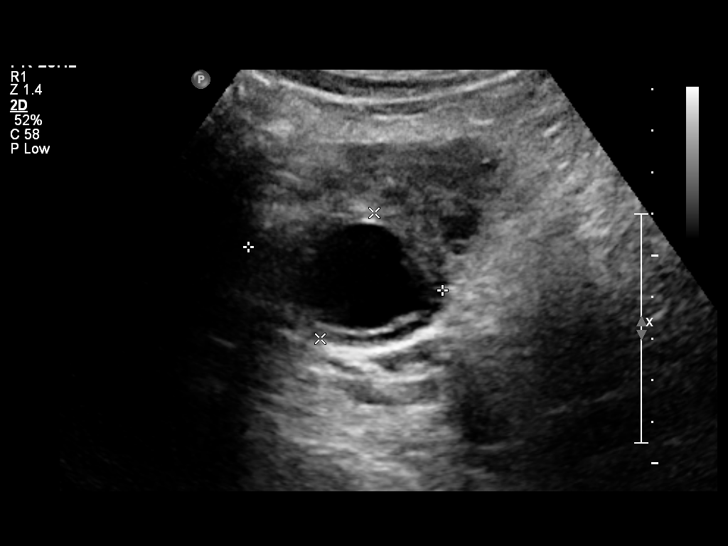
[im 20/32]
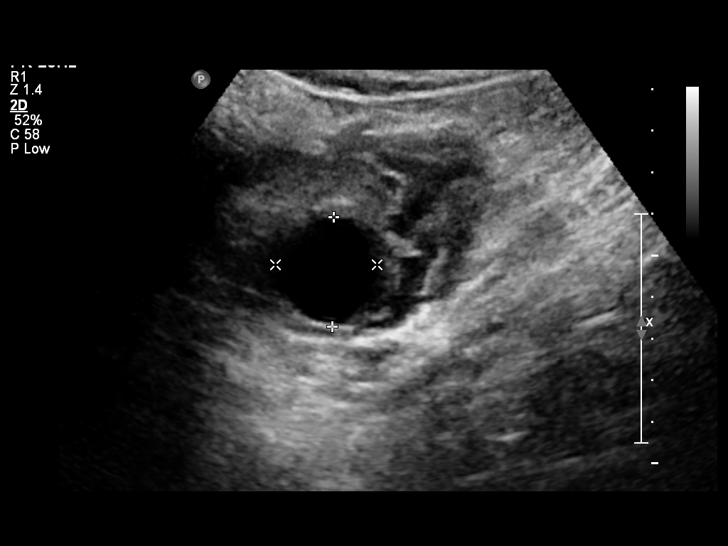
[im 22/32]
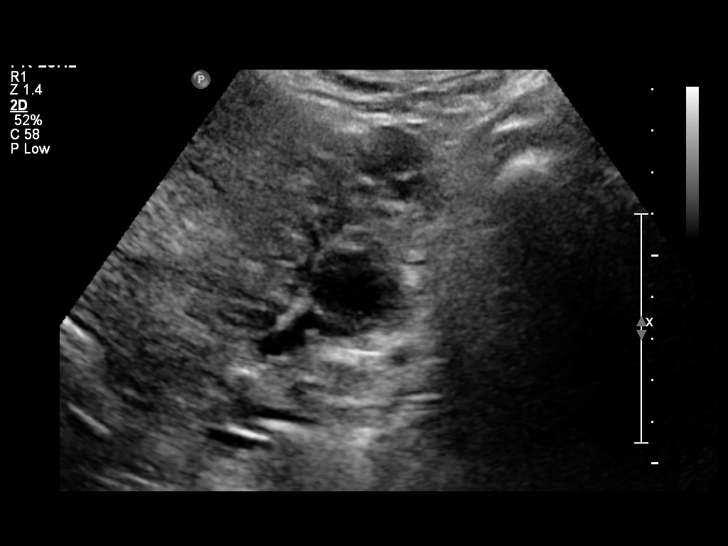
[im 25/32]
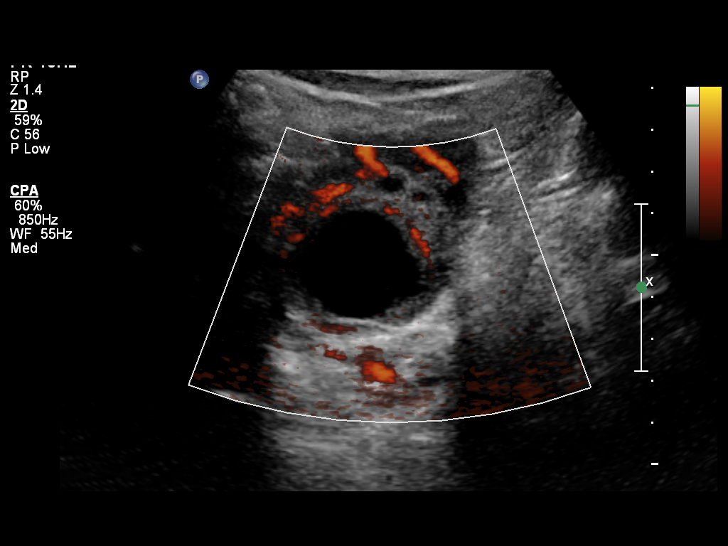
[im 27/32]
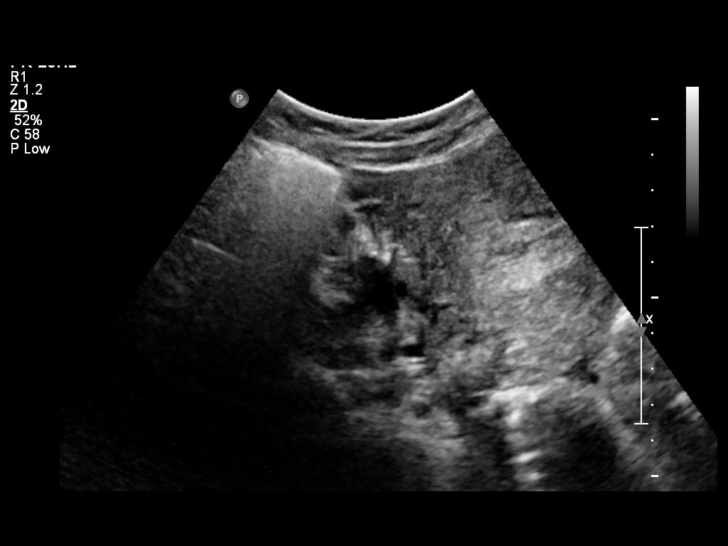
[im 29/32]
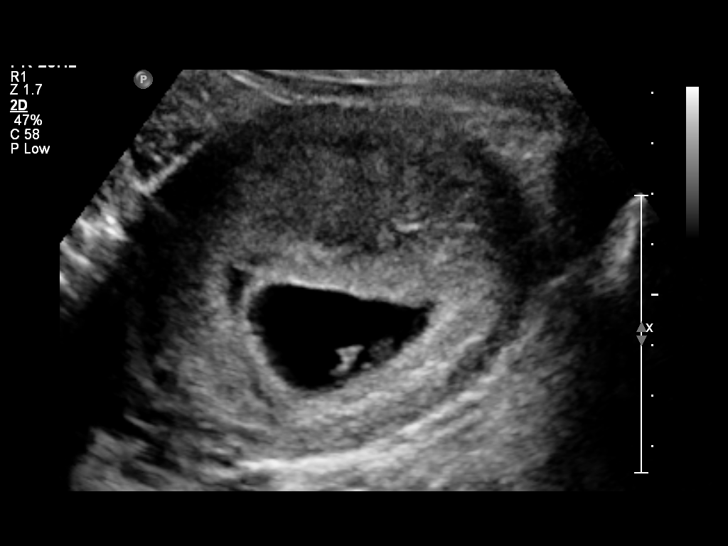
[im 32/32]
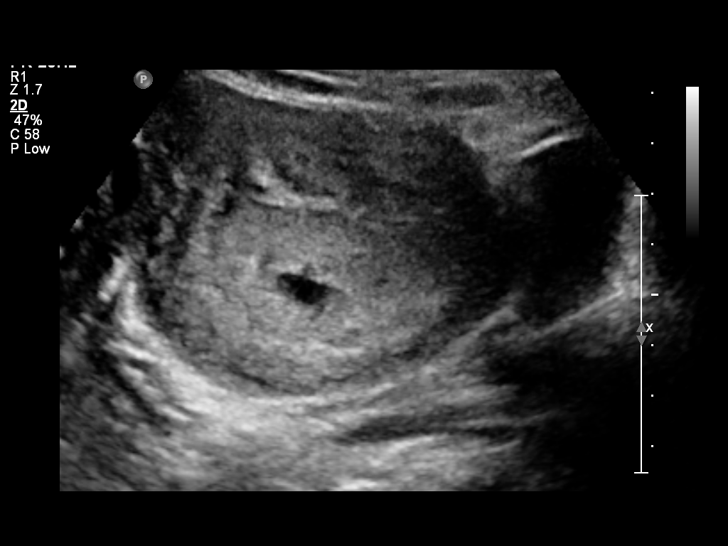

[14 of 28 positions shown; findings below may reference images not displayed]

FINDINGS: Intrauterine gestational sac: Visualized/normal in shape.

Yolk sac:  Present

Embryo:  Present

Cardiac Activity: Present

Heart Rate: 165 bpm

CRL:   21  mm   8 w 5 d                  US EDC: 06/11/2014

Maternal uterus/adnexae: Right ovary not visualized. 2.6 cm left
ovarian lesion with peripheral hyperemia is suspicious for a corpus
luteal cyst

Tiny subchorionic hemorrhage is identified, including on image 2.

No significant free fluid.
IMPRESSION: 1. Intrauterine pregnancy of 8 weeks 5 days with fetal heart rate of
165 beats per min.
2. Tiny subchorionic hemorrhage.
3. Left ovarian corpus luteal cyst.

## 2020-01-18 ENCOUNTER — Encounter: Admit: 2020-01-18 | Payer: PRIVATE HEALTH INSURANCE

## 2020-01-18 DIAGNOSIS — Z01818 Encounter for other preprocedural examination: Secondary | ICD-10-CM

## 2020-01-23 ENCOUNTER — Inpatient Hospital Stay: Admit: 2020-01-23 | Discharge: 2020-01-23 | Payer: PRIVATE HEALTH INSURANCE

## 2020-01-23 DIAGNOSIS — Z01812 Encounter for preprocedural laboratory examination: Secondary | ICD-10-CM

## 2020-01-23 DIAGNOSIS — Z01818 Encounter for other preprocedural examination: Secondary | ICD-10-CM

## 2020-01-23 DIAGNOSIS — Z20822 Contact with and (suspected) exposure to covid-19: Secondary | ICD-10-CM

## 2020-01-23 LAB — COVID-19 CLEARANCE OR FOR PLACEMENT ONLY: BKR SARS-COV-2 RNA (COVID-19) (YH): NEGATIVE

## 2020-01-24 ENCOUNTER — Encounter: Admit: 2020-01-24 | Payer: PRIVATE HEALTH INSURANCE | Attending: Reproductive Endocrinology

## 2020-01-24 ENCOUNTER — Inpatient Hospital Stay: Admit: 2020-01-24 | Discharge: 2020-01-24 | Payer: PRIVATE HEALTH INSURANCE

## 2020-01-24 MED ORDER — SODIUM CHLORIDE 0.9 % (FLUSH) INJECTION SYRINGE
0.9 % | Freq: Three times a day (TID) | INTRAVENOUS | Status: DC
Start: 2020-01-24 — End: 2020-01-24

## 2020-01-24 MED ORDER — SODIUM CHLORIDE 0.9 % (FLUSH) INJECTION SYRINGE
0.9 % | INTRAVENOUS | Status: DC | PRN
Start: 2020-01-24 — End: 2020-01-24

## 2020-01-24 NOTE — Other
Embryo Transfer Note 01/24/20 10:07 am GC Surgeon: Hyacinth Meeker Abdominal Ultrasound Guidance Visualization: Excellent Catheter: Sureview at: 7 Number of Embryos Transferred: 1 5AA Tolerated well Difficulty: None I discussed embryo development with patients and we mutually agreed to the disposition of embryos  ASRM Guidelines followed

## 2020-01-24 NOTE — Other
Pt returned walking post procedure with Tresa Endo ivf Rn. Vital signs recorded. No vaginal discharge. Pt voided post procedure. Verbal + written discharge instructions by Franky Macho rn. Pt rested x , the discharged to home self care. Walked out.

## 2020-01-24 NOTE — Interval H&P Note
No changes

## 2020-01-24 NOTE — Discharge Instructions
IVF CYCLEPOST EMBRYO TRANSFER DISCHARGE INSTRUCTIONSDISCHARGE INSTRUCTIONS:?	Continue the Estrace 3mg  orally three times a day and the Progesterone 1.74ml IM injections DAILY until your pregnancy test.Note: It is common to experience mild abdominal cramping and breast tenderness while on Progesterone. Although the package insert may warn of taking Progesterone during pregnancy, the compound is chemically the same as the progesterone produced naturally in your body and has not been found to cause harm to pregnancies.?	Tylenol CAN be taken if needed for abdominal discomfort?	Until Pregnancy Test Avoid:Heavy lifting/pulling/pushing or heavy or high impact exerciseBaths (showers ok), saunas, Jacuzzis, steam roomsTamponsAlcohol?	You may resume sexual intercourse after your pregnancy test per your contract.  If you develop any of the following, please call us at [phone] immediately:?	Temperature greater than 101.0 F?	Severe pain unrelieved by Tylenol?	Heavy vaginal bleeding?	Loss of consciousness/faintingPlease make sure the following appointments have been scheduled:	Progesterone Level Blood Check - Wednesday, February 24th	Pregnancy Blood Test - Wednesday, March 3rd

## 2020-01-24 NOTE — H&P
HPI: This 34 year old G2P3003 Caucasian female for IVF FET as a GC.  IVF FET eSET 12/20 did not result in a pregnancy. PAST MED HX: nonePAST SURGICAL HX: tonsillectomybilateral breast augmentationPAST GYN HX: 13 x 28-30 x 3-4last pap 7/19 WNL , no abnormals no mammono STDs, no endometriosis, no myomasno dysmenorrheaPAST OB HX:02/23/2006 37.5 weeks induction for preeclampsia NSVD girl Madison 5#13 oz 11/19/10 40 weeks NSVD boy Jaylen 9#3 oz shoulder dystocia7/9/15 40 weeks NSVD girl Maia 9#6 oz MEDS:OCPsPNVvitamin DALL: NKDASH:together with partner x 15, no tobacco, EtOH <1/week, no drugs, caffeine 3-4 tea c/ week, exercise walking 4X/week, diet eats all, works in a daycare in infant roomFH:no congenital anomalies, no metal retardation, no breast ca, no colon ca, no ovarian ca, Type II diabetes-fatherROS:no hot flashes, no galactorrhea,no hirsuitism, no acne, no palpitations, no weight changesPHYSICAL EXAM Ht. 69  Wt. 155 lbs.IMPRESSION/ PLAN: 34 year old G41P3003 female for IVF FET as a GC.Risks/benefits/alternatives to embryo transfer discussed with patient including bleeding & infectionInformed consent obtained

## 2020-03-03 LAB — OB RESULTS CONSOLE RPR: RPR: NONREACTIVE

## 2020-03-03 LAB — OB RESULTS CONSOLE ABO/RH: RH Type: NEGATIVE

## 2020-03-03 LAB — OB RESULTS CONSOLE RUBELLA ANTIBODY, IGM: Rubella: IMMUNE

## 2020-03-03 LAB — OB RESULTS CONSOLE HIV ANTIBODY (ROUTINE TESTING): HIV: NONREACTIVE

## 2020-03-03 LAB — OB RESULTS CONSOLE GC/CHLAMYDIA
Chlamydia: NEGATIVE
Gonorrhea: NEGATIVE

## 2020-03-03 LAB — OB RESULTS CONSOLE HEPATITIS B SURFACE ANTIGEN: Hepatitis B Surface Ag: NEGATIVE

## 2020-03-03 LAB — OB RESULTS CONSOLE ANTIBODY SCREEN: Antibody Screen: NEGATIVE

## 2020-08-23 ENCOUNTER — Ambulatory Visit (HOSPITAL_COMMUNITY)
Admission: RE | Admit: 2020-08-23 | Discharge: 2020-08-23 | Disposition: A | Discharge status: Home / Self Care | Payer: 59 | Source: Ambulatory Visit | Attending: Internal Medicine | Admitting: Internal Medicine

## 2020-08-23 ENCOUNTER — Other Ambulatory Visit: Payer: Self-pay

## 2020-08-23 DIAGNOSIS — O99013 Anemia complicating pregnancy, third trimester: Secondary | ICD-10-CM | POA: Diagnosis not present

## 2020-08-23 MED ORDER — SODIUM CHLORIDE 0.9 % IV SOLN
INTRAVENOUS | Status: DC | PRN
Start: 2020-08-23 — End: 2020-08-24
  Administered 2020-08-23: 250 mL via INTRAVENOUS

## 2020-08-23 MED ORDER — SODIUM CHLORIDE 0.9 % IV SOLN
510.0000 mg | Freq: Once | INTRAVENOUS | Status: AC
  Administered 2020-08-23: 510 mg via INTRAVENOUS
  Filled 2020-08-23: qty 510

## 2020-08-23 NOTE — Progress Notes (Signed)
Patient received IV Feraheme as ordered by Carrington Clamp MD. Observed for at least 30 minutes post infusion.Tolerated well, vitals stable, discharge instructions given, verbalized understanding. Patient alert, oriented and ambulatory at the time of discharge.

## 2020-08-23 NOTE — Discharge Instructions (Signed)

## 2020-08-30 ENCOUNTER — Other Ambulatory Visit: Payer: Self-pay

## 2020-08-30 ENCOUNTER — Ambulatory Visit (HOSPITAL_COMMUNITY)
Admission: RE | Admit: 2020-08-30 | Discharge: 2020-08-30 | Disposition: A | Discharge status: Home / Self Care | Payer: 59 | Source: Ambulatory Visit | Attending: Internal Medicine | Admitting: Internal Medicine

## 2020-08-30 DIAGNOSIS — O99013 Anemia complicating pregnancy, third trimester: Secondary | ICD-10-CM | POA: Diagnosis not present

## 2020-08-30 MED ORDER — SODIUM CHLORIDE 0.9 % IV SOLN
INTRAVENOUS | Status: DC | PRN
Start: 2020-08-30 — End: 2020-08-31
  Administered 2020-08-30: 250 mL via INTRAVENOUS

## 2020-08-30 MED ORDER — SODIUM CHLORIDE 0.9 % IV SOLN
510.0000 mg | Freq: Once | INTRAVENOUS | Status: AC
  Administered 2020-08-30: 510 mg via INTRAVENOUS
  Filled 2020-08-30: qty 510

## 2020-08-30 NOTE — Progress Notes (Signed)
Patient received IV Feraheme as ordered by Horvath Michelle MD. Observed for at least 30 minutes post infusion.Tolerated well, vitals stable, discharge instructions given, verbalized understanding. Patient alert, oriented and ambulatory at the time of discharge.  

## 2020-08-30 NOTE — Discharge Instructions (Signed)

## 2020-09-14 LAB — OB RESULTS CONSOLE GBS: GBS: POSITIVE

## 2020-09-21 ENCOUNTER — Inpatient Hospital Stay (HOSPITAL_COMMUNITY)
Admission: AD | Admit: 2020-09-21 | Discharge: 2020-09-21 | Disposition: A | Discharge status: Home / Self Care | Payer: 59 | Attending: Obstetrics | Admitting: Obstetrics

## 2020-09-21 ENCOUNTER — Other Ambulatory Visit: Payer: Self-pay

## 2020-09-21 ENCOUNTER — Encounter (HOSPITAL_COMMUNITY): Payer: Self-pay | Admitting: Obstetrics

## 2020-09-21 ENCOUNTER — Inpatient Hospital Stay (HOSPITAL_BASED_OUTPATIENT_CLINIC_OR_DEPARTMENT_OTHER): Payer: 59

## 2020-09-21 DIAGNOSIS — O4192X1 Disorder of amniotic fluid and membranes, unspecified, second trimester, fetus 1: Secondary | ICD-10-CM | POA: Insufficient documentation

## 2020-09-21 DIAGNOSIS — O43193 Other malformation of placenta, third trimester: Secondary | ICD-10-CM | POA: Diagnosis not present

## 2020-09-21 DIAGNOSIS — Z3689 Encounter for other specified antenatal screening: Secondary | ICD-10-CM | POA: Insufficient documentation

## 2020-09-21 DIAGNOSIS — Z7982 Long term (current) use of aspirin: Secondary | ICD-10-CM | POA: Insufficient documentation

## 2020-09-21 DIAGNOSIS — Z791 Long term (current) use of non-steroidal anti-inflammatories (NSAID): Secondary | ICD-10-CM | POA: Insufficient documentation

## 2020-09-21 DIAGNOSIS — Z3A37 37 weeks gestation of pregnancy: Secondary | ICD-10-CM

## 2020-09-21 DIAGNOSIS — O368330 Maternal care for abnormalities of the fetal heart rate or rhythm, third trimester, not applicable or unspecified: Secondary | ICD-10-CM | POA: Diagnosis not present

## 2020-09-21 DIAGNOSIS — Z79899 Other long term (current) drug therapy: Secondary | ICD-10-CM | POA: Diagnosis not present

## 2020-09-21 DIAGNOSIS — Z9289 Personal history of other medical treatment: Secondary | ICD-10-CM | POA: Diagnosis not present

## 2020-09-21 NOTE — Discharge Instructions (Signed)
Fetal Movement Counts Patient Name: ________________________________________________ Patient Due Date: ____________________ What is a fetal movement count?  A fetal movement count is the number of times that you feel your baby move during a certain amount of time. This may also be called a fetal kick count. A fetal movement count is recommended for every pregnant woman. You may be asked to start counting fetal movements as early as week 28 of your pregnancy. Pay attention to when your baby is most active. You may notice your baby's sleep and wake cycles. You may also notice things that make your baby move more. You should do a fetal movement count:  When your baby is normally most active.  At the same time each day. A good time to count movements is while you are resting, after having something to eat and drink. How do I count fetal movements? 1. Find a quiet, comfortable area. Sit, or lie down on your side. 2. Write down the date, the start time and stop time, and the number of movements that you felt between those two times. Take this information with you to your health care visits. 3. Write down your start time when you feel the first movement. 4. Count kicks, flutters, swishes, rolls, and jabs. You should feel at least 10 movements. 5. You may stop counting after you have felt 10 movements, or if you have been counting for 2 hours. Write down the stop time. 6. If you do not feel 10 movements in 2 hours, contact your health care provider for further instructions. Your health care provider may want to do additional tests to assess your baby's well-being. Contact a health care provider if:  You feel fewer than 10 movements in 2 hours.  Your baby is not moving like he or she usually does. Date: ____________ Start time: ____________ Stop time: ____________ Movements: ____________ Date: ____________ Start time: ____________ Stop time: ____________ Movements: ____________ Date: ____________  Start time: ____________ Stop time: ____________ Movements: ____________ Date: ____________ Start time: ____________ Stop time: ____________ Movements: ____________ Date: ____________ Start time: ____________ Stop time: ____________ Movements: ____________ Date: ____________ Start time: ____________ Stop time: ____________ Movements: ____________ Date: ____________ Start time: ____________ Stop time: ____________ Movements: ____________ Date: ____________ Start time: ____________ Stop time: ____________ Movements: ____________ Date: ____________ Start time: ____________ Stop time: ____________ Movements: ____________ This information is not intended to replace advice given to you by your health care provider. Make sure you discuss any questions you have with your health care provider. Document Revised: 07/08/2019 Document Reviewed: 07/08/2019 Elsevier Patient Education  2020 [employer]  

## 2020-09-21 NOTE — MAU Provider Note (Signed)
History     CSN: [id number]  Arrival date and time: 09/21/20 1138   First Provider Initiated Contact with Patient 09/21/20 1217      Chief Complaint  Patient presents with  . Non-stress Test   Beth Lowe is a 34 y.o. [phone] at [redacted]w[redacted]d who presents today from the office for BPP/AFI. This is a surrogate pregnancy, and patient is getting antenatal testing for 2 vessel cord. She was in the office today and variables were seen on NST. Provider wants patient to have AFI/BPP. She denies any contractions, VB or LOF. She reports normal fetal movement.    OB History    Gravida  4   Para  3   Term  3   Preterm      AB      Living  3     SAB      TAB      Ectopic      Multiple      Live Births  3           History reviewed. No pertinent past medical history.  Past Surgical History:  Procedure Laterality Date  . BREAST SURGERY    . TONSILLECTOMY      History reviewed. No pertinent family history.  Social History   Tobacco Use  . Smoking status: Never Smoker  . Smokeless tobacco: Never Used  Substance Use Topics  . Alcohol use: No  . Drug use: No    Allergies: No Known Allergies  Medications Prior to Admission  Medication Sig Dispense Refill Last Dose  . acetaminophen (TYLENOL) 500 MG tablet Take 500 mg by mouth every 6 (six) hours as needed for headache.   Past Month at Unknown time  . aspirin 81 MG chewable tablet Chew 81 mg by mouth daily.   09/21/2020 at Unknown time  . famotidine (PEPCID) 10 MG tablet Take 10 mg by mouth 2 (two) times daily.   09/21/2020 at Unknown time  . Prenatal Vit-Fe Fumarate-FA (MULTIVITAMIN-PRENATAL) 27-0.8 MG TABS tablet Take 1 tablet by mouth daily at 12 noon.   09/21/2020 at Unknown time  . ibuprofen (ADVIL,MOTRIN) 800 MG tablet Take 1 tablet (800 mg total) by mouth every 8 (eight) hours as needed for moderate pain. 60 tablet 3   . ranitidine (ZANTAC) 150 MG tablet Take 150 mg by mouth 2 (two) times daily.        Review of Systems  All other systems reviewed and are negative.  Physical Exam   Blood pressure 125/75, pulse 87, temperature 98.2 F (36.8 C), resp. rate 16, height 5\' 9"  (1.753 m), weight 91.9 kg, SpO2 100 %, unknown if currently breastfeeding.  Physical Exam Vitals and nursing note reviewed. Exam conducted with a chaperone present.  Constitutional:      General: She is not in acute distress. Cardiovascular:     Rate and Rhythm: Normal rate.  Pulmonary:     Effort: Pulmonary effort is normal.  Abdominal:     Palpations: Abdomen is soft.  Skin:    General: Skin is warm and dry.  Neurological:     Mental Status: She is alert and oriented to person, place, and time.  Psychiatric:        Mood and Affect: Mood normal.        Behavior: Behavior normal.    NST:  Baseline: 150 Variability: moderate Accels: 15x15 Decels: quick variables randomly down to approx 120 x less than 5 seconds before return to baseline. Toco:  rare  Reactive/Appropriate for GA  MAU Course  Procedures  MDM BPP 8/8, AFI 10.3cm (26%tile) Reviewed BPP with Dr. Henderson Cloud, patient ok for DC home and to FU in the office on Monday.   Assessment and Plan   1. NST (non-stress test) reactive   2. H/O fetal biophysical profile with non-stress test   3. [redacted] weeks gestation of pregnancy    DC home 3rd Trimester precautions  Labor precautions  Fetal kick counts RX: none  Return to MAU as needed    Follow-up Information    Carrington Clamp, MD Follow up.   Specialty: Obstetrics and Gynecology Why: Call for an appt to be seen on Monday  Contact information: [address] [place] [address] [phone]              Thressa Sheller DNP, CNM  09/21/20  2:40 PM

## 2020-09-21 NOTE — MAU Note (Signed)
.   Beth Lowe is a 34 y.o. at [redacted]w[redacted]d here in MAU reporting: she was sent here for monitoring and U/S, states baby had a couple of drops in heart rate in the office, she is having weekly NST in office for 2 vessel cord. Denies any pain   Onset of complaint: today Pain score: 0 Vitals:   09/21/20 1208  BP: 125/75  Pulse: 87  Resp: 16  Temp: 98.2 F (36.8 C)  SpO2: 100%     FHT:150

## 2020-09-27 ENCOUNTER — Other Ambulatory Visit: Payer: Self-pay | Admitting: Obstetrics and Gynecology

## 2020-09-28 ENCOUNTER — Telehealth (HOSPITAL_COMMUNITY): Payer: Self-pay | Admitting: *Deleted

## 2020-09-28 ENCOUNTER — Encounter (HOSPITAL_COMMUNITY): Payer: Self-pay | Admitting: *Deleted

## 2020-09-28 NOTE — Telephone Encounter (Signed)
Preadmission screen  

## 2020-10-03 ENCOUNTER — Encounter (HOSPITAL_COMMUNITY): Payer: Self-pay | Admitting: Obstetrics and Gynecology

## 2020-10-03 ENCOUNTER — Inpatient Hospital Stay (HOSPITAL_COMMUNITY): Payer: 59 | Admitting: Certified Registered Nurse Anesthetist

## 2020-10-03 ENCOUNTER — Other Ambulatory Visit: Payer: Self-pay

## 2020-10-03 ENCOUNTER — Encounter (HOSPITAL_COMMUNITY)
Admission: AD | Disposition: A | Discharge status: Home / Self Care | Payer: Self-pay | Source: Home / Self Care | Attending: Obstetrics and Gynecology

## 2020-10-03 ENCOUNTER — Other Ambulatory Visit (HOSPITAL_COMMUNITY): Payer: 59

## 2020-10-03 ENCOUNTER — Inpatient Hospital Stay (HOSPITAL_COMMUNITY)
Admission: AD | Admit: 2020-10-03 | Discharge: 2020-10-05 | DRG: 788 | Disposition: A | Discharge status: Home / Self Care | Payer: 59 | Attending: Obstetrics and Gynecology | Admitting: Obstetrics and Gynecology

## 2020-10-03 DIAGNOSIS — D509 Iron deficiency anemia, unspecified: Secondary | ICD-10-CM | POA: Diagnosis present

## 2020-10-03 DIAGNOSIS — Z20822 Contact with and (suspected) exposure to covid-19: Secondary | ICD-10-CM | POA: Diagnosis present

## 2020-10-03 DIAGNOSIS — O9902 Anemia complicating childbirth: Secondary | ICD-10-CM | POA: Diagnosis present

## 2020-10-03 DIAGNOSIS — O99824 Streptococcus B carrier state complicating childbirth: Secondary | ICD-10-CM | POA: Diagnosis present

## 2020-10-03 DIAGNOSIS — O99214 Obesity complicating childbirth: Secondary | ICD-10-CM | POA: Diagnosis present

## 2020-10-03 DIAGNOSIS — Z6791 Unspecified blood type, Rh negative: Secondary | ICD-10-CM

## 2020-10-03 DIAGNOSIS — Z3490 Encounter for supervision of normal pregnancy, unspecified, unspecified trimester: Secondary | ICD-10-CM | POA: Diagnosis present

## 2020-10-03 DIAGNOSIS — O43123 Velamentous insertion of umbilical cord, third trimester: Secondary | ICD-10-CM | POA: Diagnosis present

## 2020-10-03 DIAGNOSIS — Z3A38 38 weeks gestation of pregnancy: Secondary | ICD-10-CM | POA: Diagnosis not present

## 2020-10-03 DIAGNOSIS — O26893 Other specified pregnancy related conditions, third trimester: Secondary | ICD-10-CM | POA: Diagnosis present

## 2020-10-03 DIAGNOSIS — E669 Obesity, unspecified: Secondary | ICD-10-CM | POA: Diagnosis present

## 2020-10-03 LAB — CBC
HCT: 35.1 % — ABNORMAL LOW (ref 36.0–46.0)
Hemoglobin: 11.3 g/dL — ABNORMAL LOW (ref 12.0–15.0)
MCH: 29.8 pg (ref 26.0–34.0)
MCHC: 32.2 g/dL (ref 30.0–36.0)
MCV: 92.6 fL (ref 80.0–100.0)
Platelets: 218 10*3/uL (ref 150–400)
RBC: 3.79 MIL/uL — ABNORMAL LOW (ref 3.87–5.11)
RDW: 17 % — ABNORMAL HIGH (ref 11.5–15.5)
WBC: 9.2 10*3/uL (ref 4.0–10.5)
nRBC: 0 % (ref 0.0–0.2)

## 2020-10-03 LAB — TYPE AND SCREEN
ABO/RH(D): O NEG
Antibody Screen: POSITIVE

## 2020-10-03 LAB — RESPIRATORY PANEL BY RT PCR (FLU A&B, COVID)
Influenza A by PCR: NEGATIVE
Influenza B by PCR: NEGATIVE
SARS Coronavirus 2 by RT PCR: NEGATIVE

## 2020-10-03 SURGERY — Surgical Case
Anesthesia: Spinal

## 2020-10-03 MED ORDER — CEFAZOLIN SODIUM-DEXTROSE 2-3 GM-%(50ML) IV SOLR
INTRAVENOUS | Status: DC | PRN
  Administered 2020-10-03: 2 g via INTRAVENOUS

## 2020-10-03 MED ORDER — FENTANYL-BUPIVACAINE-NACL 0.5-0.125-0.9 MG/250ML-% EP SOLN: 12.0000 mL/h | EPIDURAL | Status: DC | PRN

## 2020-10-03 MED ORDER — PENICILLIN G POT IN DEXTROSE 60000 UNIT/ML IV SOLN: 3.0000 10*6.[IU] | INTRAVENOUS | Status: DC

## 2020-10-03 MED ORDER — PHENYLEPHRINE HCL-NACL 20-0.9 MG/250ML-% IV SOLN
INTRAVENOUS | Status: AC
  Filled 2020-10-03: qty 250

## 2020-10-03 MED ORDER — FLEET ENEMA 7-19 GM/118ML RE ENEM: 1.0000 | ENEMA | RECTAL | Status: DC | PRN

## 2020-10-03 MED ORDER — OXYCODONE-ACETAMINOPHEN 5-325 MG PO TABS: 2.0000 | ORAL_TABLET | ORAL | Status: DC | PRN

## 2020-10-03 MED ORDER — LACTATED RINGERS IV SOLN: INTRAVENOUS | Status: DC | PRN

## 2020-10-03 MED ORDER — FENTANYL CITRATE (PF) 100 MCG/2ML IJ SOLN: 25.0000 ug | INTRAMUSCULAR | Status: DC | PRN

## 2020-10-03 MED ORDER — PHENYLEPHRINE HCL-NACL 20-0.9 MG/250ML-% IV SOLN
INTRAVENOUS | Status: DC | PRN
  Administered 2020-10-03: 60 ug/min via INTRAVENOUS

## 2020-10-03 MED ORDER — LACTATED RINGERS IV SOLN: INTRAVENOUS | Status: DC

## 2020-10-03 MED ORDER — DIPHENHYDRAMINE HCL 25 MG PO CAPS: 25.0000 mg | ORAL_CAPSULE | ORAL | Status: DC | PRN

## 2020-10-03 MED ORDER — SENNOSIDES-DOCUSATE SODIUM 8.6-50 MG PO TABS
2.0000 | ORAL_TABLET | ORAL | Status: DC
  Administered 2020-10-04: 2 via ORAL
  Filled 2020-10-03: qty 2

## 2020-10-03 MED ORDER — DEXAMETHASONE SODIUM PHOSPHATE 4 MG/ML IJ SOLN
INTRAMUSCULAR | Status: AC
  Filled 2020-10-03: qty 1

## 2020-10-03 MED ORDER — FENTANYL CITRATE (PF) 100 MCG/2ML IJ SOLN
INTRAMUSCULAR | Status: DC | PRN
  Administered 2020-10-03: 15 ug via INTRATHECAL

## 2020-10-03 MED ORDER — KETOROLAC TROMETHAMINE 30 MG/ML IJ SOLN: 30.0000 mg | Freq: Four times a day (QID) | INTRAMUSCULAR | Status: AC | PRN

## 2020-10-03 MED ORDER — MEASLES, MUMPS & RUBELLA VAC IJ SOLR: 0.5000 mL | Freq: Once | INTRAMUSCULAR | Status: DC

## 2020-10-03 MED ORDER — CEFAZOLIN SODIUM-DEXTROSE 2-4 GM/100ML-% IV SOLN
INTRAVENOUS | Status: AC
  Filled 2020-10-03: qty 100

## 2020-10-03 MED ORDER — NALBUPHINE HCL 10 MG/ML IJ SOLN: 5.0000 mg | Freq: Once | INTRAMUSCULAR | Status: DC | PRN

## 2020-10-03 MED ORDER — NALBUPHINE HCL 10 MG/ML IJ SOLN: 5.0000 mg | INTRAMUSCULAR | Status: DC | PRN

## 2020-10-03 MED ORDER — SIMETHICONE 80 MG PO CHEW
80.0000 mg | CHEWABLE_TABLET | ORAL | Status: DC
  Administered 2020-10-04: 80 mg via ORAL
  Filled 2020-10-03: qty 1

## 2020-10-03 MED ORDER — NALOXONE HCL 4 MG/10ML IJ SOLN
1.0000 ug/kg/h | INTRAVENOUS | Status: DC | PRN
  Filled 2020-10-03: qty 5

## 2020-10-03 MED ORDER — BUPIVACAINE IN DEXTROSE 0.75-8.25 % IT SOLN
INTRATHECAL | Status: DC | PRN
  Administered 2020-10-03: 2 mL via INTRATHECAL

## 2020-10-03 MED ORDER — METHYLERGONOVINE MALEATE 0.2 MG PO TABS: 0.2000 mg | ORAL_TABLET | ORAL | Status: DC | PRN

## 2020-10-03 MED ORDER — ONDANSETRON HCL 4 MG/2ML IJ SOLN: 4.0000 mg | Freq: Three times a day (TID) | INTRAMUSCULAR | Status: DC | PRN

## 2020-10-03 MED ORDER — SODIUM CHLORIDE 0.9% FLUSH: 3.0000 mL | INTRAVENOUS | Status: DC | PRN

## 2020-10-03 MED ORDER — MEPERIDINE HCL 25 MG/ML IJ SOLN: 6.2500 mg | INTRAMUSCULAR | Status: DC | PRN

## 2020-10-03 MED ORDER — OXYCODONE-ACETAMINOPHEN 5-325 MG PO TABS: 1.0000 | ORAL_TABLET | ORAL | Status: DC | PRN

## 2020-10-03 MED ORDER — OXYTOCIN-SODIUM CHLORIDE 30-0.9 UT/500ML-% IV SOLN: 2.5000 [IU]/h | INTRAVENOUS | Status: DC

## 2020-10-03 MED ORDER — FENTANYL CITRATE (PF) 100 MCG/2ML IJ SOLN
INTRAMUSCULAR | Status: AC
  Filled 2020-10-03: qty 2

## 2020-10-03 MED ORDER — TETANUS-DIPHTH-ACELL PERTUSSIS 5-2.5-18.5 LF-MCG/0.5 IM SUSY: 0.5000 mL | PREFILLED_SYRINGE | Freq: Once | INTRAMUSCULAR | Status: DC

## 2020-10-03 MED ORDER — FERROUS SULFATE 325 (65 FE) MG PO TABS
325.0000 mg | ORAL_TABLET | Freq: Two times a day (BID) | ORAL | Status: DC
  Administered 2020-10-04 (×2): 325 mg via ORAL
  Filled 2020-10-03 (×3): qty 1

## 2020-10-03 MED ORDER — WITCH HAZEL-GLYCERIN EX PADS: 1.0000 "application " | MEDICATED_PAD | CUTANEOUS | Status: DC | PRN

## 2020-10-03 MED ORDER — OXYTOCIN-SODIUM CHLORIDE 30-0.9 UT/500ML-% IV SOLN
1.0000 m[IU]/min | INTRAVENOUS | Status: DC
  Administered 2020-10-03: 2 m[IU]/min via INTRAVENOUS
  Filled 2020-10-03: qty 500

## 2020-10-03 MED ORDER — OXYTOCIN-SODIUM CHLORIDE 30-0.9 UT/500ML-% IV SOLN
INTRAVENOUS | Status: AC
  Filled 2020-10-03: qty 500

## 2020-10-03 MED ORDER — ZOLPIDEM TARTRATE 5 MG PO TABS: 5.0000 mg | ORAL_TABLET | Freq: Every evening | ORAL | Status: DC | PRN

## 2020-10-03 MED ORDER — OXYTOCIN BOLUS FROM INFUSION: 333.0000 mL | Freq: Once | INTRAVENOUS | Status: DC

## 2020-10-03 MED ORDER — MENTHOL 3 MG MT LOZG: 1.0000 | LOZENGE | OROMUCOSAL | Status: DC | PRN

## 2020-10-03 MED ORDER — METHYLERGONOVINE MALEATE 0.2 MG/ML IJ SOLN: 0.2000 mg | INTRAMUSCULAR | Status: DC | PRN

## 2020-10-03 MED ORDER — ONDANSETRON HCL 4 MG/2ML IJ SOLN
INTRAMUSCULAR | Status: AC
  Filled 2020-10-03: qty 4

## 2020-10-03 MED ORDER — LIDOCAINE HCL (PF) 1 % IJ SOLN: 30.0000 mL | INTRAMUSCULAR | Status: DC | PRN

## 2020-10-03 MED ORDER — OXYTOCIN-SODIUM CHLORIDE 30-0.9 UT/500ML-% IV SOLN
2.5000 [IU]/h | INTRAVENOUS | Status: AC
  Administered 2020-10-03 – 2020-10-04 (×2): 2.5 [IU]/h via INTRAVENOUS
  Filled 2020-10-03: qty 500

## 2020-10-03 MED ORDER — DEXAMETHASONE SODIUM PHOSPHATE 4 MG/ML IJ SOLN
INTRAMUSCULAR | Status: DC | PRN
  Administered 2020-10-03: 4 mg via INTRAVENOUS

## 2020-10-03 MED ORDER — OXYCODONE-ACETAMINOPHEN 5-325 MG PO TABS
1.0000 | ORAL_TABLET | ORAL | Status: DC | PRN
  Administered 2020-10-04 – 2020-10-05 (×4): 1 via ORAL
  Filled 2020-10-03 (×4): qty 1

## 2020-10-03 MED ORDER — DIPHENHYDRAMINE HCL 25 MG PO CAPS: 25.0000 mg | ORAL_CAPSULE | Freq: Four times a day (QID) | ORAL | Status: DC | PRN

## 2020-10-03 MED ORDER — PHENYLEPHRINE 40 MCG/ML (10ML) SYRINGE FOR IV PUSH (FOR BLOOD PRESSURE SUPPORT): 80.0000 ug | PREFILLED_SYRINGE | INTRAVENOUS | Status: DC | PRN

## 2020-10-03 MED ORDER — PRENATAL MULTIVITAMIN CH
1.0000 | ORAL_TABLET | Freq: Every day | ORAL | Status: DC
  Administered 2020-10-04 – 2020-10-05 (×2): 1 via ORAL
  Filled 2020-10-03 (×2): qty 1

## 2020-10-03 MED ORDER — SIMETHICONE 80 MG PO CHEW: 80.0000 mg | CHEWABLE_TABLET | ORAL | Status: DC | PRN

## 2020-10-03 MED ORDER — COCONUT OIL OIL: 1.0000 "application " | TOPICAL_OIL | Status: DC | PRN

## 2020-10-03 MED ORDER — SOD CITRATE-CITRIC ACID 500-334 MG/5ML PO SOLN
30.0000 mL | ORAL | Status: DC | PRN
  Administered 2020-10-03: 30 mL via ORAL
  Filled 2020-10-03: qty 15

## 2020-10-03 MED ORDER — SODIUM CHLORIDE 0.9 % IV SOLN
5.0000 10*6.[IU] | Freq: Once | INTRAVENOUS | Status: AC
  Administered 2020-10-03: 5 10*6.[IU] via INTRAVENOUS
  Filled 2020-10-03: qty 5

## 2020-10-03 MED ORDER — NALOXONE HCL 0.4 MG/ML IJ SOLN: 0.4000 mg | INTRAMUSCULAR | Status: DC | PRN

## 2020-10-03 MED ORDER — FLEET ENEMA 7-19 GM/118ML RE ENEM: 1.0000 | ENEMA | Freq: Every day | RECTAL | Status: DC | PRN

## 2020-10-03 MED ORDER — TERBUTALINE SULFATE 1 MG/ML IJ SOLN: 0.2500 mg | Freq: Once | INTRAMUSCULAR | Status: DC | PRN

## 2020-10-03 MED ORDER — LACTATED RINGERS IV SOLN: 500.0000 mL | Freq: Once | INTRAVENOUS | Status: DC

## 2020-10-03 MED ORDER — MORPHINE SULFATE (PF) 0.5 MG/ML IJ SOLN
INTRAMUSCULAR | Status: DC | PRN
  Administered 2020-10-03: .15 mg via INTRATHECAL

## 2020-10-03 MED ORDER — BISACODYL 10 MG RE SUPP: 10.0000 mg | Freq: Every day | RECTAL | Status: DC | PRN

## 2020-10-03 MED ORDER — SODIUM CHLORIDE 0.9 % IR SOLN
Status: DC | PRN
  Administered 2020-10-03: 1

## 2020-10-03 MED ORDER — EPHEDRINE 5 MG/ML INJ: 10.0000 mg | INTRAVENOUS | Status: DC | PRN

## 2020-10-03 MED ORDER — OXYTOCIN-SODIUM CHLORIDE 30-0.9 UT/500ML-% IV SOLN
INTRAVENOUS | Status: DC | PRN
  Administered 2020-10-03: 400 mL via INTRAVENOUS

## 2020-10-03 MED ORDER — SIMETHICONE 80 MG PO CHEW
80.0000 mg | CHEWABLE_TABLET | Freq: Three times a day (TID) | ORAL | Status: DC
  Administered 2020-10-04 – 2020-10-05 (×4): 80 mg via ORAL
  Filled 2020-10-03 (×4): qty 1

## 2020-10-03 MED ORDER — ONDANSETRON HCL 4 MG/2ML IJ SOLN: 4.0000 mg | Freq: Four times a day (QID) | INTRAMUSCULAR | Status: DC | PRN

## 2020-10-03 MED ORDER — DIPHENHYDRAMINE HCL 50 MG/ML IJ SOLN: 12.5000 mg | INTRAMUSCULAR | Status: DC | PRN

## 2020-10-03 MED ORDER — ONDANSETRON HCL 4 MG/2ML IJ SOLN
INTRAMUSCULAR | Status: DC | PRN
  Administered 2020-10-03: 4 mg via INTRAVENOUS

## 2020-10-03 MED ORDER — FAMOTIDINE 20 MG PO TABS
10.0000 mg | ORAL_TABLET | Freq: Two times a day (BID) | ORAL | Status: DC
  Administered 2020-10-04: 10 mg via ORAL
  Filled 2020-10-03 (×2): qty 1

## 2020-10-03 MED ORDER — IBUPROFEN 800 MG PO TABS
800.0000 mg | ORAL_TABLET | Freq: Three times a day (TID) | ORAL | Status: DC
  Administered 2020-10-04 – 2020-10-05 (×5): 800 mg via ORAL
  Filled 2020-10-03 (×6): qty 1

## 2020-10-03 MED ORDER — DIBUCAINE (PERIANAL) 1 % EX OINT: 1.0000 "application " | TOPICAL_OINTMENT | CUTANEOUS | Status: DC | PRN

## 2020-10-03 MED ORDER — MORPHINE SULFATE (PF) 0.5 MG/ML IJ SOLN
INTRAMUSCULAR | Status: AC
  Filled 2020-10-03: qty 10

## 2020-10-03 MED ORDER — LACTATED RINGERS IV SOLN
500.0000 mL | INTRAVENOUS | Status: DC | PRN
  Administered 2020-10-03: 500 mL via INTRAVENOUS

## 2020-10-03 MED ORDER — ACETAMINOPHEN 325 MG PO TABS: 650.0000 mg | ORAL_TABLET | ORAL | Status: DC | PRN

## 2020-10-03 SURGICAL SUPPLY — 36 items
APL SKNCLS STERI-STRIP NONHPOA (GAUZE/BANDAGES/DRESSINGS) ×1
BENZOIN TINCTURE PRP APPL 2/3 (GAUZE/BANDAGES/DRESSINGS) ×2 IMPLANT
BLADE SURG 10 STRL SS (BLADE) ×1 IMPLANT
CHLORAPREP W/TINT 26ML (MISCELLANEOUS) ×2 IMPLANT
CLAMP CORD UMBIL (MISCELLANEOUS) IMPLANT
CLOSURE STERI STRIP 1/2 X4 (GAUZE/BANDAGES/DRESSINGS) ×1 IMPLANT
CLOTH BEACON ORANGE TIMEOUT ST (SAFETY) ×2 IMPLANT
DRSG OPSITE POSTOP 4X10 (GAUZE/BANDAGES/DRESSINGS) ×2 IMPLANT
ELECT REM PT RETURN 9FT ADLT (ELECTROSURGICAL) ×2
ELECTRODE REM PT RTRN 9FT ADLT (ELECTROSURGICAL) ×1 IMPLANT
EXTRACTOR VACUUM BELL STYLE (SUCTIONS) IMPLANT
GLOVE BIO SURGEON STRL SZ7 (GLOVE) ×2 IMPLANT
GLOVE BIOGEL PI IND STRL 7.0 (GLOVE) ×1 IMPLANT
GLOVE BIOGEL PI INDICATOR 7.0 (GLOVE) ×1
GOWN STRL REUS W/TWL LRG LVL3 (GOWN DISPOSABLE) ×4 IMPLANT
KIT ABG SYR 3ML LUER SLIP (SYRINGE) IMPLANT
NDL HYPO 25X5/8 SAFETYGLIDE (NEEDLE) IMPLANT
NEEDLE HYPO 25X5/8 SAFETYGLIDE (NEEDLE) ×2 IMPLANT
NS IRRIG 1000ML POUR BTL (IV SOLUTION) ×2 IMPLANT
PACK C SECTION WH (CUSTOM PROCEDURE TRAY) ×2 IMPLANT
PAD OB MATERNITY 4.3X12.25 (PERSONAL CARE ITEMS) ×2 IMPLANT
PENCIL SMOKE EVAC W/HOLSTER (ELECTROSURGICAL) ×2 IMPLANT
RTRCTR C-SECT PINK 25CM LRG (MISCELLANEOUS) ×2 IMPLANT
STRIP CLOSURE SKIN 1/2X4 (GAUZE/BANDAGES/DRESSINGS) ×2 IMPLANT
SUT MNCRL 0 VIOLET CTX 36 (SUTURE) ×2 IMPLANT
SUT MONOCRYL 0 CTX 36 (SUTURE) ×6
SUT PLAIN 2 0 XLH (SUTURE) IMPLANT
SUT VIC AB 0 CT1 27 (SUTURE) ×4
SUT VIC AB 0 CT1 27XBRD ANBCTR (SUTURE) ×2 IMPLANT
SUT VIC AB 2-0 CT1 27 (SUTURE) ×2
SUT VIC AB 2-0 CT1 TAPERPNT 27 (SUTURE) ×1 IMPLANT
SUT VIC AB 4-0 KS 27 (SUTURE) ×3 IMPLANT
SYR 3ML 25GX5/8 SAFETY (SYRINGE) ×1 IMPLANT
TOWEL OR 17X24 6PK STRL BLUE (TOWEL DISPOSABLE) ×2 IMPLANT
TRAY FOLEY W/BAG SLVR 14FR LF (SET/KITS/TRAYS/PACK) ×2 IMPLANT
WATER STERILE IRR 1000ML POUR (IV SOLUTION) ×3 IMPLANT

## 2020-10-03 NOTE — Anesthesia Procedure Notes (Signed)
Spinal  Patient location during procedure: OR Start time: 10/03/2020 8:28 PM End time: 10/03/2020 8:30 PM Staffing Performed: anesthesiologist  Anesthesiologist: Mal Amabile, MD Preanesthetic Checklist Completed: patient identified, IV checked, site marked, risks and benefits discussed, surgical consent, monitors and equipment checked, pre-op evaluation and timeout performed Spinal Block Patient position: sitting Prep: DuraPrep and site prepped and draped Patient monitoring: heart rate, cardiac monitor, continuous pulse ox and blood pressure Approach: midline Location: L3-4 Injection technique: single-shot Needle Needle type: Pencan  Needle gauge: 24 G Needle length: 9 cm Needle insertion depth: 5 cm Assessment Sensory level: T4 Additional Notes Patient tolerated procedure well. Adequate sensory level.

## 2020-10-03 NOTE — Brief Op Note (Signed)
10/03/2020  9:22 PM  PATIENT:  Beth Lowe  34 y.o. female  PRE-OPERATIVE DIAGNOSIS:  Primary cesarean section; nonreassuring fetal heart tracing remote from delivery  POST-OPERATIVE DIAGNOSIS:  Primary cesarean section; nonreassuring fetal heart tracing remote from delivery  PROCEDURE:  Procedure(s): CESAREAN SECTION (N/A)  SURGEON:  Surgeon(s) and Role:    * Carrington Clamp, MD - Primary   ASSISTANTS: Yehuda Mao MD   ANESTHESIA:   spinal  EBL: 620 cc  SPECIMEN:  Source of Specimen:  placenta  DISPOSITION OF SPECIMEN:  PATHOLOGY  COUNTS:  YES  TOURNIQUET:  * No tourniquets in log *  DICTATION: .Note written in EPIC  PLAN OF CARE: Admit to inpatient   PATIENT DISPOSITION:  PACU - hemodynamically stable.   Delay start of Pharmacological VTE agent (>24hrs) due to surgical blood loss or risk of bleeding: not applicable

## 2020-10-03 NOTE — Transfer of Care (Signed)
Immediate Anesthesia Transfer of Care Note  Patient: Beth Lowe  Procedure(s) Performed: CESAREAN SECTION (N/A )  Patient Location: PACU  Anesthesia Type:Spinal  Level of Consciousness: awake, alert  and patient cooperative  Airway & Oxygen Therapy: Patient Spontanous Breathing  Post-op Assessment: Report given to RN and Post -op Vital signs reviewed and stable  Post vital signs: Reviewed and stable  Last Vitals:  Vitals Value Taken Time  BP 102/55 10/03/20 2132  Temp 36.5 C 10/03/20 2132  Pulse 66 10/03/20 2135  Resp 14 10/03/20 2135  SpO2 100 % 10/03/20 2135  Vitals shown include unvalidated device data.  Last Pain:  Vitals:   10/03/20 2132  TempSrc: Oral         Complications: No complications documented.

## 2020-10-03 NOTE — Anesthesia Preprocedure Evaluation (Signed)
Anesthesia Evaluation  Patient identified by MRN, date of birth, ID band Patient awake    Reviewed: Allergy & Precautions, NPO status , Patient's Chart, lab work & pertinent test results  Airway Mallampati: II  TM Distance: >3 FB Neck ROM: Full    Dental no notable dental hx. (+) Teeth Intact   Pulmonary neg pulmonary ROS,    Pulmonary exam normal breath sounds clear to auscultation       Cardiovascular negative cardio ROS Normal cardiovascular exam Rhythm:Regular Rate:Normal     Neuro/Psych negative neurological ROS  negative psych ROS   GI/Hepatic Neg liver ROS, GERD  Medicated and Controlled,  Endo/Other  Obesity  Renal/GU negative Renal ROS  negative genitourinary   Musculoskeletal negative musculoskeletal ROS (+)   Abdominal (+) + obese,   Peds  Hematology  (+) anemia ,   Anesthesia Other Findings   Reproductive/Obstetrics (+) Pregnancy Non reassuring FHR tracing                             Anesthesia Physical Anesthesia Plan  ASA: II and emergent  Anesthesia Plan: Spinal   Post-op Pain Management:    Induction:   PONV Risk Score and Plan: 4 or greater and Treatment may vary due to age or medical condition and Scopolamine patch - Pre-op  Airway Management Planned: Natural Airway  Additional Equipment:   Intra-op Plan:   Post-operative Plan:   Informed Consent: I have reviewed the patients History and Physical, chart, labs and discussed the procedure including the risks, benefits and alternatives for the proposed anesthesia with the patient or authorized representative who has indicated his/her understanding and acceptance.     Dental advisory given  Plan Discussed with: CRNA and Anesthesiologist  Anesthesia Plan Comments:         Anesthesia Quick Evaluation

## 2020-10-03 NOTE — Progress Notes (Signed)
Pt comfortable still without pain meds.  Vitals:   10/03/20 1737 10/03/20 1831 10/03/20 1906 10/03/20 1939  BP: 132/77 115/70 (!) 114/59 113/61  Pulse: 80 96 73 86  Resp:    16  Temp:    98.2 F (36.8 C)  TempSrc:    Oral  Weight:      Height:       FHTs 140-150s, gSTV, NST R but now repetitive severe variable decels to 50s in late presentation. Toco q 3-5 SVE per nurse unchanged.   A: Baby appears to be doing well between contractions but with most contractions now is having severe variables remote from vaginal dellvery   D/w pt and parents that baby has reserve but is clearly not tolerating labor.  For urgent c/s.  Declines tubal.  All risks, benefits d/w pt and she agrees to proceed.    Loney Laurence

## 2020-10-03 NOTE — Anesthesia Postprocedure Evaluation (Signed)
Anesthesia Post Note  Patient: [name]  Procedure(s) Performed: CESAREAN SECTION (N/A )     Patient location during evaluation: PACU Anesthesia Type: Spinal Level of consciousness: oriented and awake and alert Pain management: pain level controlled Vital Signs Assessment: post-procedure vital signs reviewed and stable Respiratory status: spontaneous breathing, respiratory function stable and nonlabored ventilation Cardiovascular status: blood pressure returned to baseline and stable Postop Assessment: no headache, no backache, no apparent nausea or vomiting, spinal receding and patient able to bend at knees Anesthetic complications: no   No complications documented.  Last Vitals:  Vitals:   10/03/20 2245 10/03/20 2250  BP: 117/71   Pulse: 65 73  Resp: 14 15  Temp:    SpO2: 100% 100%    Last Pain:  Vitals:   10/03/20 2215  TempSrc: Oral   Pain Goal:                Epidural/Spinal Function Cutaneous sensation: Able to Discern Pressure (10/03/20 2230), Patient able to flex knees: No (10/03/20 2230), Patient able to lift hips off bed: No (10/03/20 2230), Back pain beyond tenderness at insertion site: No (10/03/20 2230), Progressively worsening motor and/or sensory loss: No (10/03/20 2230), Bowel and/or bladder incontinence post epidural: No (10/03/20 2230)  Richa Shor A.

## 2020-10-03 NOTE — H&P (Signed)
Beth Lowe is a 34 y.o. female [phone] [redacted]w[redacted]d presenting for IOL 2/2 fetal decels on monitoring in clinic. Patient was seen by Dr. Henderson Cloud today for OB visit, NST for [facility], decel to 60s for 4 mins. Patient reported pelvic pressure, no other concerns. Denies LOF/VB. Reported FM Pregnancy c/b: 1. Two vessel cord and marginal cord insertion. Growth 09/14/2020 6lb2oz, 41% 2. Gestational carrier: embryo transfer on 2/22 3. Rh negative: s/p rhogam at 28w 4. Anemia of pregnancy/Iron deficiency anemia: received IV iron x2 5. History of preeclampsia with G1 not G2/G3  OB History    Gravida  4   Para  3   Term  3   Preterm      AB      Living  3     SAB      TAB      Ectopic      Multiple      Live Births  3          History reviewed. No pertinent past medical history. Past Surgical History:  Procedure Laterality Date  . BREAST SURGERY    . TONSILLECTOMY     Family History: family history is not on file. Social History:  reports that she has never smoked. She has never used smokeless tobacco. She reports that she does not drink alcohol and does not use drugs.     Maternal Diabetes: No Genetic Screening: Normal - MateniT21 negative Maternal Ultrasounds/Referrals: 2 vessel cord and marginal insertion, Normal anatomy. Posterior placenta Fetal Ultrasounds or other Referrals:  None Maternal Substance Abuse:  No Significant Maternal Medications:  None Significant Maternal Lab Results:  Group B Strep positive and Rh negative Other Comments:  None  Review of Systems Per HPI Exam Physical Exam  Dilation: 3 Effacement (%): 50 Station: -3 Exam by:: J.Cox, RN Blood pressure 132/77, pulse 80, temperature 98 F (36.7 C), temperature source Oral, height 5\' 9"  (1.753 m), weight 93.5 kg, unknown if currently breastfeeding. Fetal testing: FHR 155, +moderate variability, +accels. Occasional variables, including deep variable to 60 for 2 minutes. Toco q5-72mins Prenatal  labs: ABO, Rh:  --/--/O NEG (11/02 1645) Antibody: POS (11/02 1645) Rubella: Immune (04/02 0000) RPR: Nonreactive (04/02 0000)  HBsAg: Negative (04/02 0000)  HIV: Non-reactive (04/02 0000)  GBS: Positive/-- (10/14 0000)   Recent Labs    10/03/20 1652  WBC 9.2  HGB 11.3*  HCT 35.1*  PLT 218   Assessment/Plan: Beth Lowe 34 y.o. Beth Lowe at [redacted]w[redacted]d here for IOL 2/2 fetal decel in clinic 1. IOL: On admission 3/50/-3, plan pitocin/AROM. GBS+ on PCN. 2. Fetal monitoring: having occasional variables on monitoring. Counseled by Dr. [redacted]w[redacted]d on need for CS if nonreassuring testing   3. Gestational carrier 4. Rh negative: s/p rhogam at 28w, plan PP pending baby 5. Anemia of pregnancy/Iron deficiency anemia: received IV iron x2. Hgb now 11.3  Beth Lowe K Taam-Akelman 10/03/2020, 5:58 PM

## 2020-10-03 NOTE — Op Note (Signed)
10/03/2020  9:22 PM  PATIENT:  Beth Lowe  34 y.o. female  PRE-OPERATIVE DIAGNOSIS:  Primary cesarean section; nonreassuring fetal heart tracing remote from delivery  POST-OPERATIVE DIAGNOSIS:  Primary cesarean section; nonreassuring fetal heart tracing remote from delivery  PROCEDURE:  Procedure(s): CESAREAN SECTION (N/A)  SURGEON:  Surgeon(s) and Role:    * Carrington Clamp, MD - Primary   ASSISTANTS: Yehuda Mao MD   ANESTHESIA:   spinal  EBL: 620 cc  SPECIMEN:  Source of Specimen:  placenta  DISPOSITION OF SPECIMEN:  PATHOLOGY  COUNTS:  YES  TOURNIQUET:  * No tourniquets in log *  DICTATION: .Note written in EPIC  PLAN OF CARE: Admit to inpatient   PATIENT DISPOSITION:  PACU - hemodynamically stable.   Delay start of Pharmacological VTE agent (>24hrs) due to surgical blood loss or risk of bleeding: not applicable  Complications:  none Medications:  Ancef, Pitocin Findings:  Baby female, Apgars 9,9, weight P.   Normal tubes, ovaries and uterus seen.  Baby was skin to skin with actual mother after birth in the OR.  Technique:  After adequate spinal anesthesia was achieved, the patient was prepped and draped in usual sterile fashion.  A foley catheter was used to drain the bladder.  A pfannanstiel incision was made with the scalpel and carried down to the fascia with the bovie cautery. The fascia was incised in the midline with the scalpel and carried in a transverse curvilinear manner bilaterally.  The fascia was reflected superiorly and inferiorly off the rectus muscles and the muscles split in the midline.  A bowel free portion of the peritoneum was entered bluntly and then extended in a superior and inferior manner with good visualization of the bowel and bladder.  The Alexis instrument was then placed and the vesico-uterine fascia tented up and incised in a transverse curvilinear manner.   A 2 cm defect was seen in the serosa of the uterus above the lower  uterine segment.  This was bleeding slightly and was closed with two stitches of 0 vicryl.  A 2 cm transverse incision was made in the upper portion of the lower uterine segment until the amnion was exposed.   The incision was extended transversely in a blunt manner.  Thick meconium fluid was noted and the baby delivered in the vertex presentation without complication.  The baby was bulb suctioned and the cord was clamped and cut aftet stripping blood from cord into baby.  The baby was then handed to awaiting Neonatology.  The placenta was then delivered manually and the uterus cleared of all debris.  The uterine incision was then closed with a running lock stitch of 0 monocryl.  An imbricating layer of 0 monocryl was closed as well. Excellent hemostasis of the uterine incision was achieved and the abdomen was cleared with irrigation.  The peritoneum was closed with a running stitch of 2-0 vicryl.  This incorporated the rectus muscles as a separate layer.  The fascia was then closed with a running stitch of 0 vicryl.  The subcutaneous layer was closed with interrupted  stitches of 2-0 plain gut.  The skin was closed with 4-0 vicryl on a Keith needle and steri-strips.  The patient tolerated the procedure well and was returned to the recovery room in stable condition.  All counts were correct times three.  Loney Laurence

## 2020-10-04 ENCOUNTER — Encounter (HOSPITAL_COMMUNITY): Payer: Self-pay | Admitting: Obstetrics and Gynecology

## 2020-10-04 LAB — CBC
HCT: 27.9 % — ABNORMAL LOW (ref 36.0–46.0)
Hemoglobin: 9 g/dL — ABNORMAL LOW (ref 12.0–15.0)
MCH: 29.9 pg (ref 26.0–34.0)
MCHC: 32.3 g/dL (ref 30.0–36.0)
MCV: 92.7 fL (ref 80.0–100.0)
Platelets: 187 10*3/uL (ref 150–400)
RBC: 3.01 MIL/uL — ABNORMAL LOW (ref 3.87–5.11)
RDW: 16.6 % — ABNORMAL HIGH (ref 11.5–15.5)
WBC: 13.1 10*3/uL — ABNORMAL HIGH (ref 4.0–10.5)
nRBC: 0 % (ref 0.0–0.2)

## 2020-10-04 LAB — RPR: RPR Ser Ql: NONREACTIVE

## 2020-10-04 MED ORDER — RHO D IMMUNE GLOBULIN 1500 UNIT/2ML IJ SOSY
300.0000 ug | PREFILLED_SYRINGE | Freq: Once | INTRAMUSCULAR | Status: AC
  Administered 2020-10-04: 300 ug via INTRAVENOUS
  Filled 2020-10-04: qty 2

## 2020-10-04 MED ORDER — FERROUS SULFATE 325 (65 FE) MG PO TABS: 325.0000 mg | ORAL_TABLET | Freq: Every day | ORAL | Status: DC

## 2020-10-04 NOTE — Progress Notes (Signed)
Subjective: Postpartum Day 1: Cesarean Delivery Patient reports pain controlled, no nausea or vomiting  Objective: Vital signs in last 24 hours: Temp:  [97.6 F (36.4 C)-98.7 F (37.1 C)] 98.3 F (36.8 C) (11/03 1028) Pulse Rate:  [58-136] 61 (11/03 1028) Resp:  [9-18] 18 (11/03 1028) BP: (100-132)/(55-80) 101/61 (11/03 1028) SpO2:  [97 %-100 %] 100 % (11/03 1028) Weight:  [93.5 kg] 93.5 kg (11/02 1640)  Physical Exam:  General: alert, cooperative and appears stated age Lochia: appropriate Uterine Fundus: firm Incision: healing well DVT Evaluation: No evidence of DVT seen on physical exam.  Recent Labs    10/03/20 1652 10/04/20 0539  HGB 11.3* 9.0*  HCT 35.1* 27.9*    Assessment/Plan: Status post Cesarean section. Doing well postoperatively.  Continue current care. Post op anemia, continue iron supplementation Pt gestational carrier. C/S 830 yesterday evening yesterday. Plan D/C tomorrow  Waynard Reeds 10/04/2020, 11:15 AM

## 2020-10-05 ENCOUNTER — Inpatient Hospital Stay (HOSPITAL_COMMUNITY): Admission: AD | Admit: 2020-10-05 | Payer: 59 | Source: Home / Self Care | Admitting: Obstetrics and Gynecology

## 2020-10-05 ENCOUNTER — Inpatient Hospital Stay (HOSPITAL_COMMUNITY): Payer: 59

## 2020-10-05 LAB — RH IG WORKUP (INCLUDES ABO/RH)
ABO/RH(D): O NEG
Fetal Screen: NEGATIVE
Gestational Age(Wks): 38.6
Unit division: 0

## 2020-10-05 MED ORDER — OXYCODONE-ACETAMINOPHEN 5-325 MG PO TABS: 1.0000 | ORAL_TABLET | ORAL | 0 refills | Status: DC | PRN

## 2020-10-05 MED ORDER — FERROUS SULFATE 325 (65 FE) MG PO TABS: 325.0000 mg | ORAL_TABLET | Freq: Two times a day (BID) | ORAL | 3 refills | Status: DC

## 2020-10-05 NOTE — Progress Notes (Signed)
  Patient is eating, ambulating, voiding.  Pain control is good.  Vitals:   10/04/20 1028 10/04/20 1445 10/04/20 2014 10/05/20 0507  BP: 101/61 110/66 109/64 108/73  Pulse: 61 68 72 65  Resp: 18 16 18 18   Temp: 98.3 F (36.8 C) 98.4 F (36.9 C) 98.1 F (36.7 C) 97.8 F (36.6 C)  TempSrc: Oral Oral Oral Oral  SpO2: 100% 100% 100% 100%  Weight:      Height:        lungs:   clear to auscultation cor:    RRR Abdomen:  soft, appropriate tenderness, incisions intact and without erythema or exudate ex:    no cords   Lab Results  Component Value Date   WBC 13.1 (H) 10/04/2020   HGB 9.0 (L) 10/04/2020   HCT 27.9 (L) 10/04/2020   MCV 92.7 10/04/2020   PLT 187 10/04/2020    --/--/O NEG (11/03 0539)/RI  A/P    Post operative day 2.  Rhogam given.  Routine post op and postpartum care.  Expect d/c today.  Percocet for pain control. Iron for anemia.

## 2020-10-05 NOTE — Discharge Summary (Signed)
Postpartum Discharge Summary  Date of Service updated        Patient Name: Beth Lowe DOB: 09-12-1986 MRN: [id number]  Date of admission: 10/03/2020 Delivery date:10/03/2020  Delivering provider: Bobbye Charleston  Date of discharge: 10/05/2020  Admitting diagnosis: Encounter for elective induction of labor [Z34.90] Intrauterine pregnancy: [redacted]w[redacted]d    Secondary diagnosis:  Active Problems:   Encounter for elective induction of labor  Additional problems: gestational carrier, anemia    Discharge diagnosis: Term Pregnancy Delivered                                              Post partum procedures:none Augmentation: Pitocin Complications: None  Hospital course: Induction of Labor With Cesarean Section   34y.o. yo [id number] admitted to the hospital 10/03/2020 for induction of labor. Patient had a labor course significant for nonreassuring FHTS. The patient went for cesarean section due to Non-Reassuring FHR. Delivery details are as follows: Membrane Rupture Time/Date: 8:46 PM ,10/03/2020   Delivery Method:C-Section, Low Transverse  Details of operation can be found in separate operative Note.  Patient had an uncomplicated postpartum course. She is ambulating, tolerating a regular diet, passing flatus, and urinating well.  Patient is discharged home in stable condition on 10/05/20.      Newborn Data: Birth date:10/03/2020  Birth time:8:46 PM  Gender:Female  Living status:Living  Apgars:9 ,9  [phone]                                 Magnesium Sulfate received: No BMZ received: No Rhophylac:Yes MMR:No T-DaP:Given prenatally Flu: No Transfusion:No  Physical exam  Vitals:   10/04/20 1028 10/04/20 1445 10/04/20 2014 10/05/20 0507  BP: 101/61 110/66 109/64 108/73  Pulse: 61 68 72 65  Resp: '18 16 18 18  ' Temp: 98.3 F (36.8 C) 98.4 F (36.9 C) 98.1 F (36.7 C) 97.8 F (36.6 C)  TempSrc: Oral Oral Oral Oral  SpO2: 100% 100% 100% 100%  Weight:       Height:       Labs: Lab Results  Component Value Date   WBC 13.1 (H) 10/04/2020   HGB 9.0 (L) 10/04/2020   HCT 27.9 (L) 10/04/2020   MCV 92.7 10/04/2020   PLT 187 10/04/2020   No flowsheet data found. Edinburgh Score: No flowsheet data found.    After visit meds:  Allergies as of 10/05/2020   No Known Allergies     Medication List    STOP taking these medications   aspirin 81 MG chewable tablet     TAKE these medications   acetaminophen 500 MG tablet Commonly known as: TYLENOL Take 500 mg by mouth every 6 (six) hours as needed for headache.   famotidine 10 MG tablet Commonly known as: PEPCID Take 10 mg by mouth 2 (two) times daily.   ferrous sulfate 325 (65 FE) MG tablet Take 1 tablet (325 mg total) by mouth 2 (two) times daily with a meal.   multivitamin-prenatal 27-0.8 MG Tabs tablet Take 1 tablet by mouth daily at 12 noon.   oxyCODONE-acetaminophen 5-325 MG tablet Commonly known as: PERCOCET/ROXICET Take 1 tablet by mouth every 4 (four) hours as needed for severe pain.        Discharge home in stable condition Infant  Feeding: n/a Infant Disposition:home with bio parents Discharge instruction: per After Visit Summary and Postpartum booklet. Activity: Advance as tolerated. Pelvic rest for 6 weeks.  Diet: routine diet Anticipated Birth Control: Unsure Postpartum Appointment:4 weeks Additional Postpartum F/U: none Future Appointments:No future appointments. Follow up Visit:  Follow-up Information    Bobbye Charleston, MD Follow up in 4 week(s).   Specialty: Obstetrics and Gynecology Contact information: [address] [place]. [place] [address] [phone]                   10/05/2020 Daria Pastures, MD

## 2020-10-06 LAB — SURGICAL PATHOLOGY

## 2021-08-24 IMAGING — US US MFM FETAL BPP W/O NON-STRESS
1 series · 12 of 26 positions shown · non-contrast
Comparison: none

[Series 1: us mfm fetal bpp w/o non-stress · 26 acquisitions, 12 frames shown]
[im 2/26]
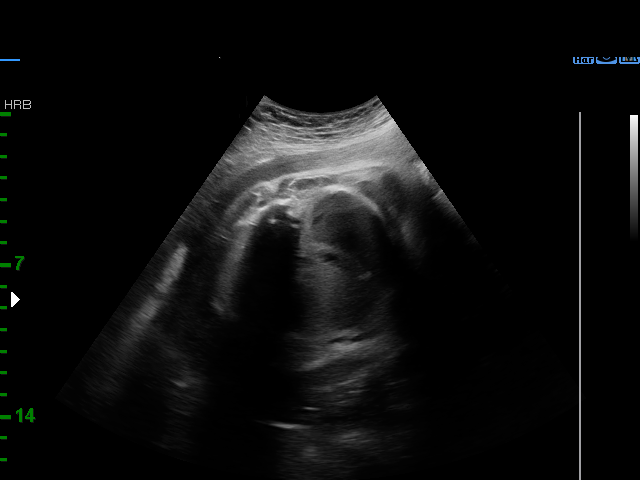
[im 4/26]
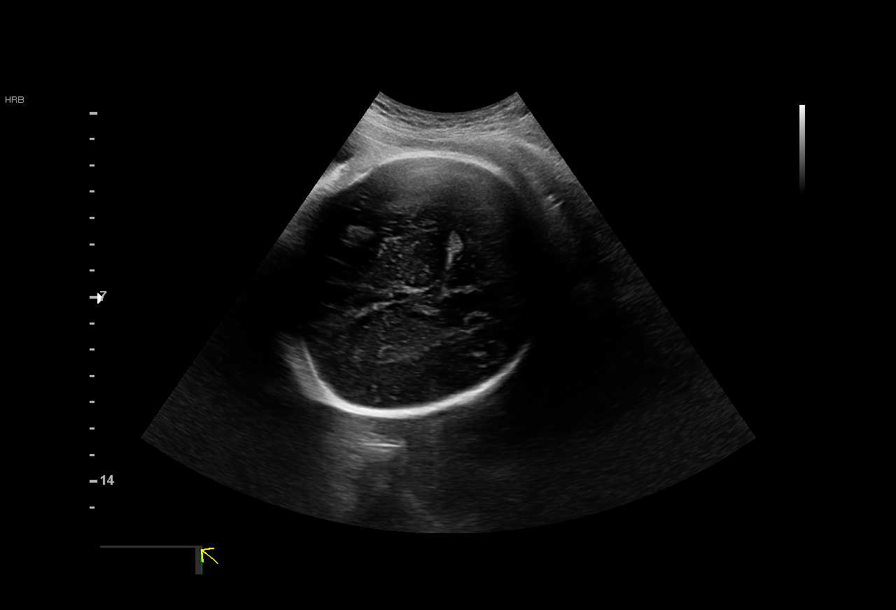
[im 6/26]
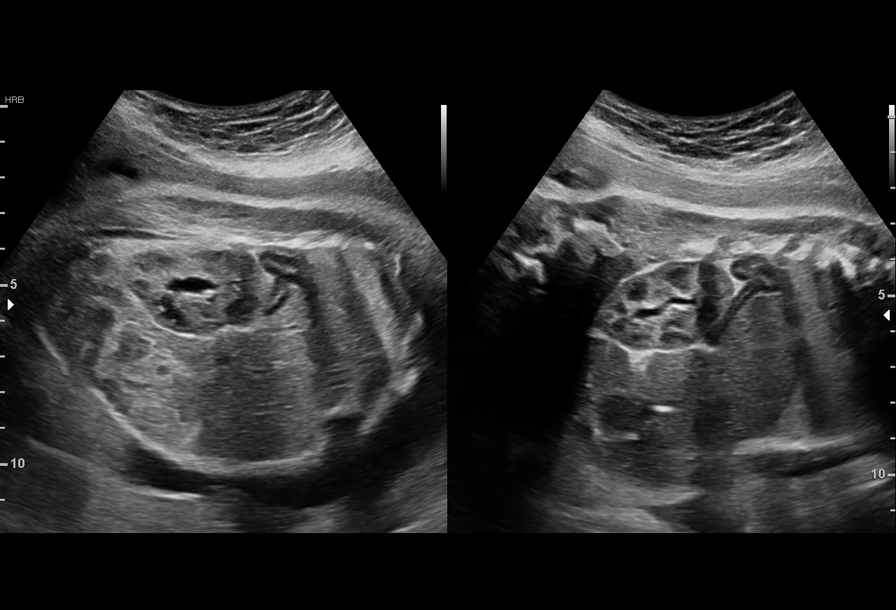
[im 8/26]
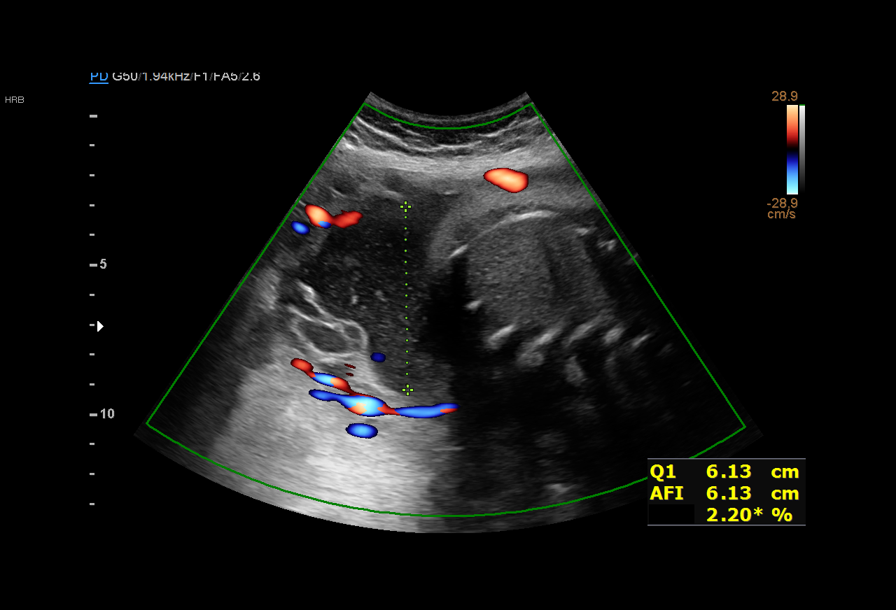
[im 10/26]
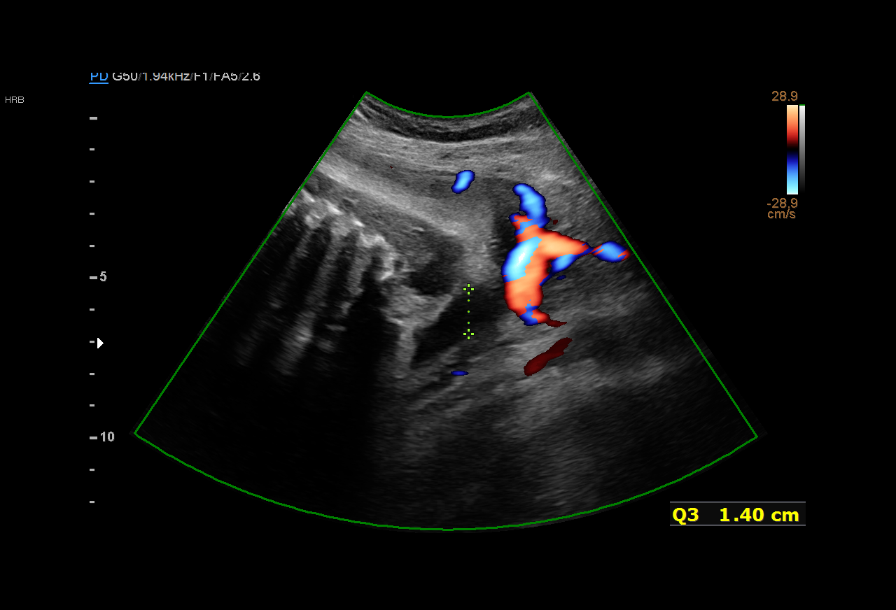
[im 12/26]
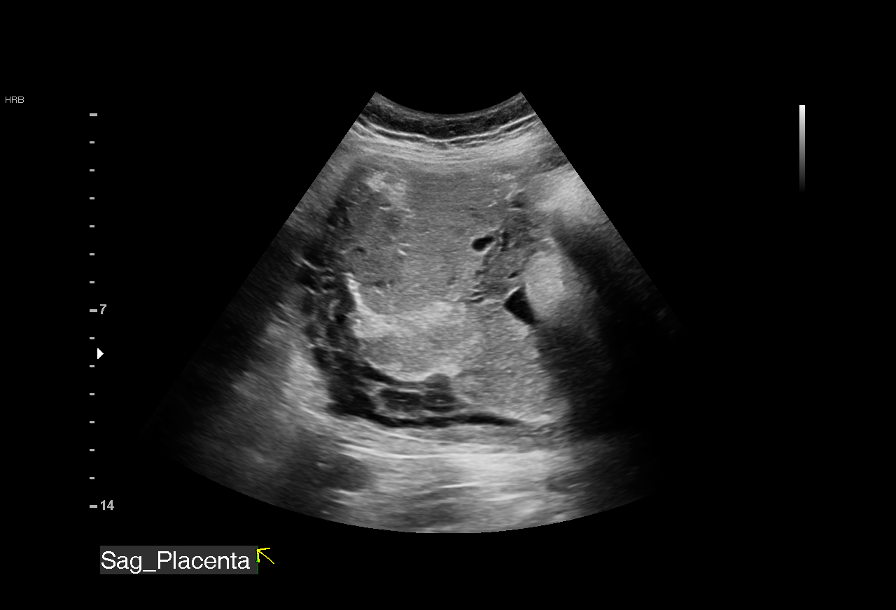
[im 15/26]
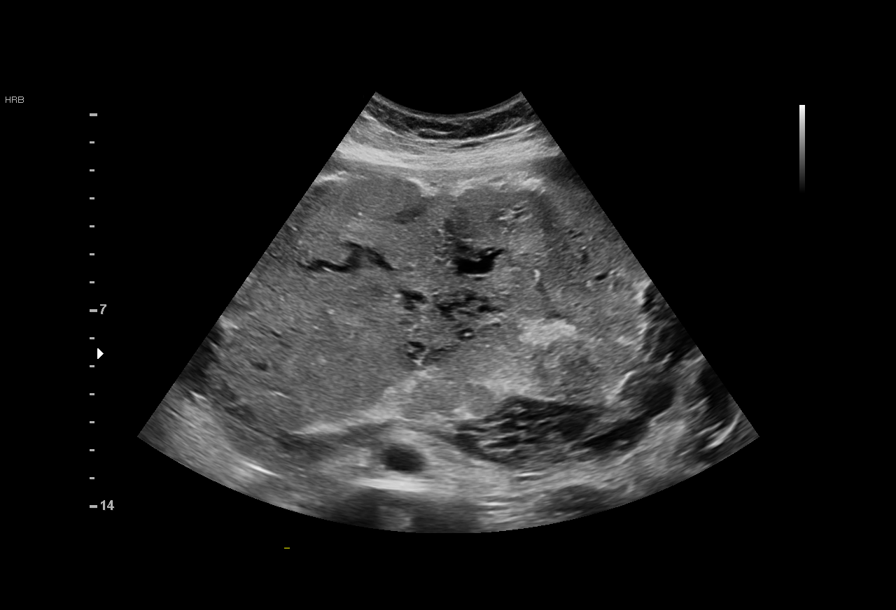
[im 17/26]
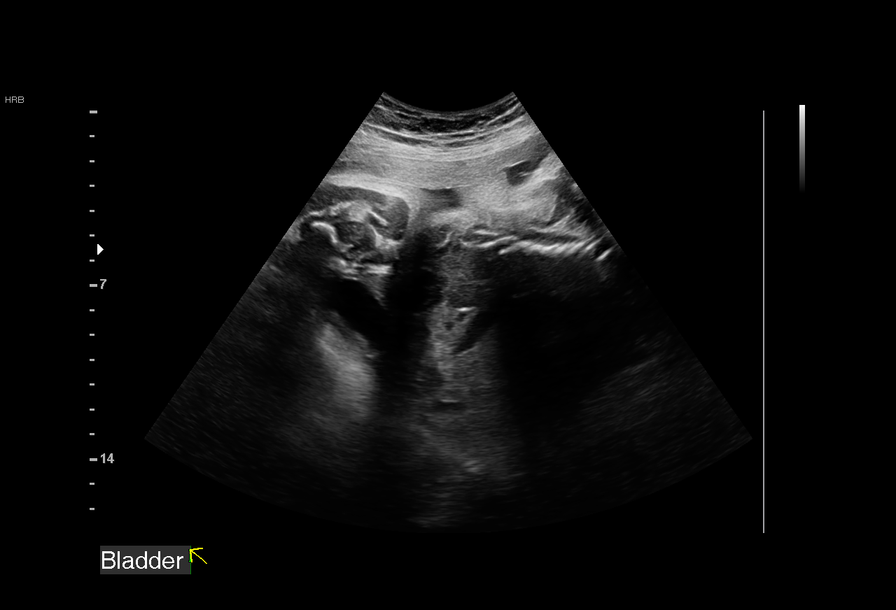
[im 19/26]
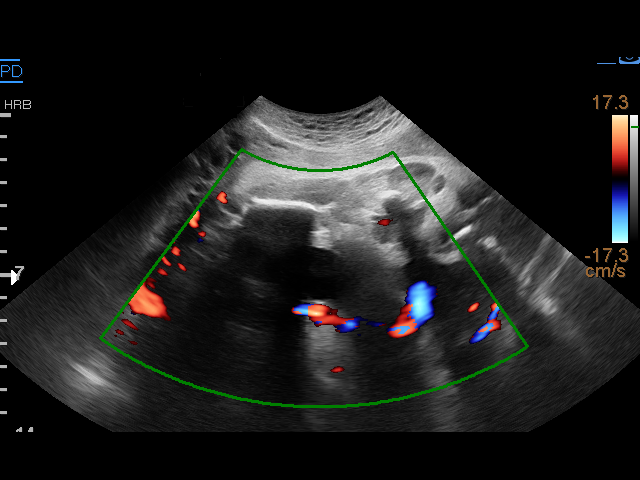
[im 21/26]
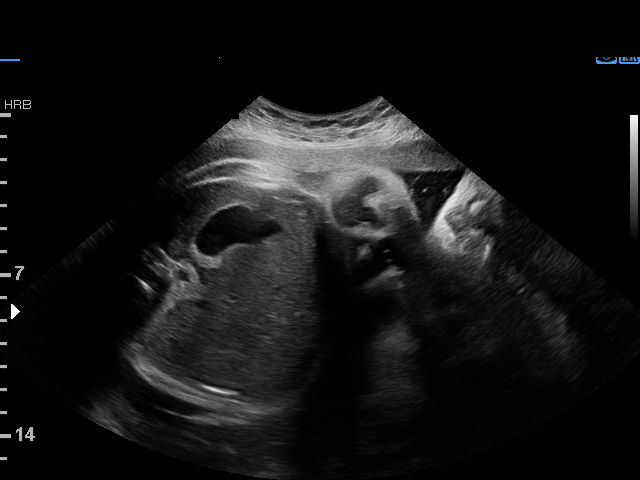
[im 23/26]
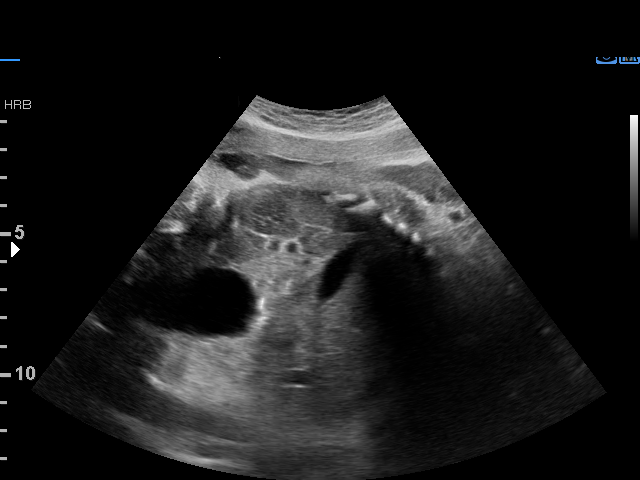
[im 25/26]
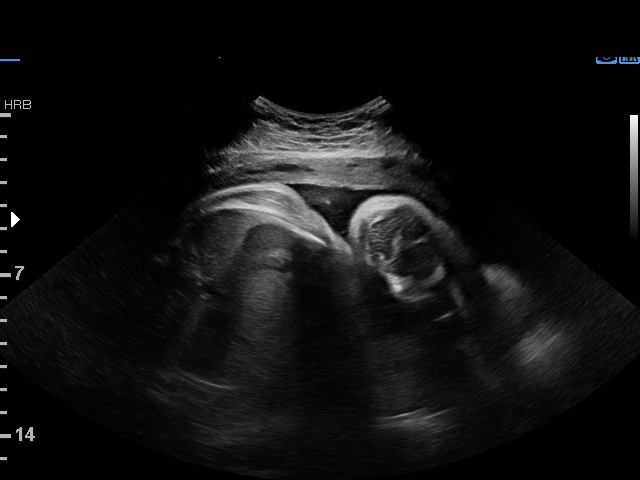

[12 of 26 positions shown; findings below may reference images not displayed]

Indications

 Fetal heart rate decelerations affecting
 management of mother
 2 vessel umbilical cord
 37 weeks gestation of pregnancy
Fetal Evaluation

 Num Of Fetuses:          1
 Fetal Heart Rate(bpm):   140
 Cardiac Activity:        Observed
 Presentation:            Cephalic
 Placenta:                Fundal

 Amniotic Fluid
 AFI FV:      Within normal limits

 AFI Sum(cm)     %Tile       Largest Pocket(cm)
 10.3            26

 RUQ(cm)       RLQ(cm)       LUQ(cm)        LLQ(cm)
 6.1           2.8           0
Biophysical Evaluation

 Amniotic F.V:   Within normal limits       F. Tone:         Observed
 F. Movement:    Observed                   Score:           [DATE]
 F. Breathing:   Observed
OB History
 Gravidity:    4         Term:   3
 Living:       3
Gestational Age

 Best:          37w 1d     Det. By:  Previous Ultrasound      EDD:   10/11/20
Anatomy

 Thoracic:              Appears normal         Kidneys:                Appear normal
 Stomach:               Appears normal, left   Bladder:                Appears normal
                        sided
 Cord Vessels:          2 Vessel Cord
Cervix Uterus Adnexa

 Cervix
 Not visualized (advanced GA >74wks)
Impression

 Limited exam to assess fetal heart rated decelerations with
 known single umbilical artery.
 Biophysical profile [DATE] with good fetal movement and
 amniotic fluid volume
 Consider daily maternal Auntyjatty Delowr.
Recommendations

 Follow up per inpatient provider.
 Consider delivery if heart rate declearations persist given
 term gestation.

## 2024-12-23 ENCOUNTER — Other Ambulatory Visit: Payer: Self-pay | Admitting: Medical Genetics

## 2024-12-28 ENCOUNTER — Ambulatory Visit: Admitting: Family Medicine

## 2024-12-30 ENCOUNTER — Encounter: Payer: Self-pay | Admitting: Family Medicine

## 2024-12-30 ENCOUNTER — Ambulatory Visit: Admitting: Family Medicine

## 2024-12-30 VITALS — BP 112/80 | HR 81 | Temp 98.4°F | Resp 18 | Ht 69.0 in | Wt 180.2 lb

## 2024-12-30 DIAGNOSIS — R002 Palpitations: Secondary | ICD-10-CM

## 2024-12-30 DIAGNOSIS — Z862 Personal history of diseases of the blood and blood-forming organs and certain disorders involving the immune mechanism: Secondary | ICD-10-CM | POA: Diagnosis not present

## 2024-12-30 NOTE — Progress Notes (Signed)
 "  New Patient Office Visit  Subjective    Patient ID: Beth Lowe, female    DOB: 08/30/1986  Age: 39 y.o. MRN: [id number]  CC:  Chief Complaint  Patient presents with   Establish Care    Discussed the use of AI scribe software for clinical note transcription with the patient, who gave verbal consent to proceed.  History of Present Illness   Beth Lowe is a 39 year old female who presents with heart palpitations.  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 - Episodes described as irregular heartbeats lasting 30 to 40 seconds before returning to normal rhythm. - Palpitations occur sporadically and are not associated with menstrual cycle or stress. - Palpitations have persisted post-pregnancy. - No history of heart disease.  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 - Was on hormone medication during pregnancy. - No association of palpitations with menstrual cycle.  Hematologic findings - Low hemoglobin level noted during last pregnancy, attributed to blood loss. - No history of iron deficiency anemia.  Thyroid function - No history of thyroid problems. - Family history notable for father's potential thyroid issues.  Lifestyle and review of systems - No significant caffeine consumption. - No other symptoms requiring attention.       Outpatient Encounter Medications as of 12/30/2024  Medication Sig   [DISCONTINUED] acetaminophen  (TYLENOL ) 500 MG tablet Take 500 mg by mouth every 6 (six) hours as needed for headache.   [DISCONTINUED] famotidine  (PEPCID ) 10 MG tablet Take 10 mg by mouth 2 (two) times daily.   [DISCONTINUED] ferrous sulfate  325 (65 FE) MG tablet Take 1 tablet (325 mg total) by mouth 2 (two) times daily with a meal.   [DISCONTINUED] oxyCODONE -acetaminophen  (PERCOCET/ROXICET) 5-325 MG tablet Take 1 tablet by mouth every 4 (four) hours as needed for  severe pain.   [DISCONTINUED] Prenatal Vit-Fe Fumarate-FA (MULTIVITAMIN-PRENATAL) 27-0.8 MG TABS tablet Take 1 tablet by mouth daily at 12 noon.   No facility-administered encounter medications on file as of 12/30/2024.    History reviewed. No pertinent past medical history.  Past Surgical History:  Procedure Laterality Date   BREAST SURGERY     CESAREAN SECTION N/A 10/03/2020   Procedure: CESAREAN SECTION;  Surgeon: Sarrah Browning, MD;  Location: [facility];  Service: Obstetrics;  Laterality: N/A;   COSMETIC SURGERY     TONSILLECTOMY      History reviewed. No pertinent family history.  Social History   Socioeconomic History   Marital status: Single    Spouse name: Not on file   Number of children: Not on file   Years of education: Not on file   Highest education level: Associate degree: occupational, scientist, product/process development, or vocational program  Occupational History   Not on file  Tobacco Use   Smoking status: Never   Smokeless tobacco: Never  Vaping Use   Vaping status: Never Used  Substance and Sexual Activity   Alcohol use: Never   Drug use: Never   Sexual activity: Yes    Birth control/protection: Condom  Other Topics Concern   Not on file  Social History Narrative   Not on file   Social Drivers of Health   Tobacco Use: Low Risk (12/30/2024)   Patient History    Smoking Tobacco Use: Never    Smokeless Tobacco Use: Never    Passive Exposure: Not on file  Financial Resource Strain: Low Risk (12/26/2024)  Overall Financial Resource Strain (CARDIA)    Difficulty of Paying Living Expenses: Not hard at all  Food Insecurity: No Food Insecurity (12/26/2024)   Epic    Worried About Programme Researcher, Broadcasting/film/video in the Last Year: Never true    Ran Out of Food in the Last Year: Never true  Transportation Needs: No Transportation Needs (12/26/2024)   Epic    Lack of Transportation (Medical): No    Lack of Transportation (Non-Medical): No  Physical Activity: Sufficiently Active  (12/26/2024)   Exercise Vital Sign    Days of Exercise per Week: 4 days    Minutes of Exercise per Session: 60 min  Stress: No Stress Concern Present (12/26/2024)   [employer] of Occupational Health - Occupational Stress Questionnaire    Feeling of Stress: Only a little  Social Connections: Moderately Isolated (12/26/2024)   Social Connection and Isolation Panel    Frequency of Communication with Friends and Family: More than three times a week    Frequency of Social Gatherings with Friends and Family: Once a week    Attends Religious Services: Never    Database Administrator or Organizations: No    Attends Engineer, Structural: Not on file    Marital Status: Living with partner  Intimate Partner Violence: Not on file  Depression (PHQ2-9): Not on file  Alcohol Screen: Not on file  Housing: Low Risk (12/26/2024)   Epic    Unable to Pay for Housing in the Last Year: No    Number of Times Moved in the Last Year: 0    Homeless in the Last Year: No  Utilities: Not on file  Health Literacy: Not on file    Review of Systems  Constitutional:  Negative for chills, fever and malaise/fatigue.  HENT:  Negative for congestion, ear pain, sinus pain and sore throat.   Eyes: Negative.   Respiratory:  Negative for cough, shortness of breath and wheezing.   Cardiovascular:  Positive for palpitations. Negative for chest pain and leg swelling.  Gastrointestinal:  Negative for constipation, diarrhea, nausea and vomiting.  Genitourinary:  Negative for dysuria, frequency and urgency.  Musculoskeletal: Negative.   Skin: Negative.   Neurological:  Negative for dizziness and headaches.  Endo/Heme/Allergies: Negative.   Psychiatric/Behavioral: Negative.          Objective    BP 112/80   Pulse 81   Temp 98.4 F (36.9 C) (Temporal)   Resp 18   Ht 5' 9 (1.753 m)   Wt 180 lb 3.2 oz (81.7 kg)   SpO2 100%   BMI 26.61 kg/m   Physical Exam Constitutional:      General: She is  not in acute distress.    Appearance: Normal appearance. She is not ill-appearing.  HENT:     Nose: No congestion.  Eyes:     Conjunctiva/sclera: Conjunctivae normal.  Cardiovascular:     Rate and Rhythm: Normal rate and regular rhythm.     Heart sounds: Normal heart sounds. No murmur heard. Pulmonary:     Effort: Pulmonary effort is normal. No respiratory distress.     Breath sounds: Normal breath sounds. No wheezing.  Musculoskeletal:        General: Normal range of motion.  Neurological:     Mental Status: She is alert and oriented to person, place, and time.  Psychiatric:        Mood and Affect: Mood normal.        Behavior: Behavior normal.  Assessment & Plan:   Problem List Items Addressed This Visit       Other   Heart palpitations - Primary   Heart palpitations Intermittent palpitations since 2021, possibly linked to hormone medication. No family history of heart disease. Differential includes thyroid dysfunction or electrolyte imbalance. - Referred to cardiology for further evaluation. - Ordered EKG, NSR w/ RBBB. No ST elevation or depression  - Ordered thyroid function tests. - Ordered vitamin D , B12, magnesium , and potassium tests.      Relevant Orders   Ambulatory referral to Cardiology   EKG 12-Lead (Completed)   Lipid Panel (Completed)   Comprehensive metabolic panel with GFR (Completed)   TSH (Completed)   B12 and Folate Panel (Completed)   Vitamin D , 25-hydroxy (Completed)   Magnesium  (Completed)   History of iron deficiency anemia   History of iron deficiency anemia Previous low hemoglobin likely due to pregnancy-related blood loss. - Ordered blood count to assess current hemoglobin levels.          Relevant Orders   CBC with Differential (Completed)    Follow-up: Return in about 4 weeks (around 01/27/2025) for Annual Physical with pap.   Harrie Cedar, FNP [facility] [phone]    "

## 2024-12-31 ENCOUNTER — Ambulatory Visit: Attending: Physician Assistant | Admitting: Physician Assistant

## 2024-12-31 VITALS — BP 100/80 | HR 72 | Ht 69.0 in | Wt 181.0 lb

## 2024-12-31 DIAGNOSIS — R002 Palpitations: Secondary | ICD-10-CM | POA: Diagnosis not present

## 2024-12-31 DIAGNOSIS — I4510 Unspecified right bundle-branch block: Secondary | ICD-10-CM

## 2024-12-31 LAB — CBC WITH DIFFERENTIAL/PLATELET
Basophils Absolute: 0 10*3/uL (ref 0.0–0.2)
Basos: 1 %
EOS (ABSOLUTE): 0.2 10*3/uL (ref 0.0–0.4)
Eos: 5 %
Hematocrit: 36.7 % (ref 34.0–46.6)
Hemoglobin: 11.5 g/dL (ref 11.1–15.9)
Immature Grans (Abs): 0 10*3/uL (ref 0.0–0.1)
Immature Granulocytes: 0 %
Lymphocytes Absolute: 1.1 10*3/uL (ref 0.7–3.1)
Lymphs: 26 %
MCH: 26.6 pg (ref 26.6–33.0)
MCHC: 31.3 g/dL — ABNORMAL LOW (ref 31.5–35.7)
MCV: 85 fL (ref 79–97)
Monocytes Absolute: 0.4 10*3/uL (ref 0.1–0.9)
Monocytes: 9 %
Neutrophils Absolute: 2.5 10*3/uL (ref 1.4–7.0)
Neutrophils: 59 %
Platelets: 320 10*3/uL (ref 150–450)
RBC: 4.32 x10E6/uL (ref 3.77–5.28)
RDW: 14.1 % (ref 11.7–15.4)
WBC: 4.3 10*3/uL (ref 3.4–10.8)

## 2024-12-31 LAB — COMPREHENSIVE METABOLIC PANEL WITH GFR
ALT: 13 [IU]/L (ref 0–32)
AST: 17 [IU]/L (ref 0–40)
Albumin: 4.8 g/dL (ref 3.9–4.9)
Alkaline Phosphatase: 65 [IU]/L (ref 41–116)
BUN/Creatinine Ratio: 14 (ref 9–23)
BUN: 11 mg/dL (ref 6–20)
Bilirubin Total: 0.3 mg/dL (ref 0.0–1.2)
CO2: 21 mmol/L (ref 20–29)
Calcium: 9.7 mg/dL (ref 8.7–10.2)
Chloride: 102 mmol/L (ref 96–106)
Creatinine, Ser: 0.78 mg/dL (ref 0.57–1.00)
Globulin, Total: 2.6 g/dL (ref 1.5–4.5)
Glucose: 86 mg/dL (ref 70–99)
Potassium: 4.4 mmol/L (ref 3.5–5.2)
Sodium: 139 mmol/L (ref 134–144)
Total Protein: 7.4 g/dL (ref 6.0–8.5)
eGFR: 100 mL/min/{1.73_m2}

## 2024-12-31 LAB — LIPID PANEL
Chol/HDL Ratio: 2.6 ratio (ref 0.0–4.4)
Cholesterol, Total: 174 mg/dL (ref 100–199)
HDL: 67 mg/dL
LDL Chol Calc (NIH): 92 mg/dL (ref 0–99)
Triglycerides: 81 mg/dL (ref 0–149)
VLDL Cholesterol Cal: 15 mg/dL (ref 5–40)

## 2024-12-31 LAB — VITAMIN D 25 HYDROXY (VIT D DEFICIENCY, FRACTURES): Vit D, 25-Hydroxy: 26.3 ng/mL — ABNORMAL LOW (ref 30.0–100.0)

## 2024-12-31 LAB — B12 AND FOLATE PANEL
Folate: 15.8 ng/mL
Vitamin B-12: 983 pg/mL (ref 232–1245)

## 2024-12-31 LAB — MAGNESIUM: Magnesium: 2 mg/dL (ref 1.6–2.3)

## 2024-12-31 LAB — TSH: TSH: 1.21 u[IU]/mL (ref 0.450–4.500)

## 2024-12-31 NOTE — Patient Instructions (Signed)
 Medication Instructions:  Your physician recommends that you continue on your current medications as directed. Please refer to the Current Medication list given to you today.  *If you need a refill on your cardiac medications before your next appointment, please call your pharmacy*  Lab Work: If you have labs (blood work) drawn today and your tests are completely normal, you will receive your results only by: MyChart Message (if you have MyChart) OR A paper copy in the mail If you have any lab test that is abnormal or we need to change your treatment, we will call you to review the results.  Testing/Procedures: Your physician has requested that you have an echocardiogram. Echocardiography is a painless test that uses sound waves to create images of your heart. It provides your doctor with information about the size and shape of your heart and how well your hearts chambers and valves are working. This procedure takes approximately one hour. There are no restrictions for this procedure. Please do NOT wear cologne, perfume, aftershave, or lotions (deodorant is allowed). Please arrive 15 minutes prior to your appointment time.  Please note: We ask at that you not bring children with you during ultrasound (echo/ vascular) testing. Due to room size and safety concerns, children are not allowed in the ultrasound rooms during exams. Our front office staff cannot provide observation of children in our lobby area while testing is being conducted. An adult accompanying a patient to their appointment will only be allowed in the ultrasound room at the discretion of the ultrasound technician under special circumstances. We apologize for any inconvenience. Your physician has recommended that you wear an event monitor. Event monitors are medical devices that record the hearts electrical activity. Doctors most often us  these monitors to diagnose arrhythmias. Arrhythmias are problems with the speed or rhythm of the  heartbeat. The monitor is a small, portable device. You can wear one while you do your normal daily activities. This is usually used to diagnose what is causing palpitations/syncope (passing out).   Follow-Up: At [facility], you and your health needs are our priority.  As part of our continuing mission to provide you with exceptional heart care, our providers are all part of one team.  This team includes your primary Cardiologist (physician) and Advanced Practice Providers or APPs (Physician Assistants and Nurse Practitioners) who all work together to provide you with the care you need, when you need it.  Your next appointment:   2 month(s)  Provider:   Scot Ford PA   We recommend signing up for the patient portal called MyChart.  Sign up information is provided on this After Visit Summary.  MyChart is used to connect with patients for Virtual Visits (Telemedicine).  Patients are able to view lab/test results, encounter notes, upcoming appointments, etc.  Non-urgent messages can be sent to your provider as well.   To learn more about what you can do with MyChart, go to [email or web address].   Other Instructions

## 2024-12-31 NOTE — Progress Notes (Unsigned)
" °  Cardiology Office Note   Date:  12/31/2024  ID:  KIMARIE COOR, DOB 1986/10/13, MRN [id number] PCP: Teressa Harrie CHRISTELLA, FNP  [facility] Providers Cardiologist:  None { Click to update primary MD,subspecialty MD or APP then REFRESH:1}    History of Present Illness Rahcel MICHALINA CALBERT is a 39 y.o. female with no significant past medical history who was referred to cardiology service for palpitation.  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  She was recently established with PCP yesterday and was referred to cardiology service for persistent palpitation that would last anywhere between 30 to 40 seconds at a time.  CBC vitamin D /folate, CMP, lipid panel, magnesium , and TSH were normal.  Vitamin D  borderline low.  Patient [facility] the [facility] for evaluation of palpitation.  Palpitation has been present since 2021 during her last pregnancy.  She experiences palpitation about once every 1 to 2 months.  Symptom would last up to 2 minutes and may be associated with dizziness.  Otherwise, she is fairly healthy and goes to the gym 3 times a week.  She is able to run up to 3 miles without any issue.  I will obtain an echocardiogram to rule out structural heart issue and a 30-day heart monitor.  I will tentatively schedule 31-month follow-up.  If both study are normal, she can cancel the follow-up and follow-up with cardiology service as needed.  If her monitor is normal, and that she continue to have palpitation in the future, we did review possibility of Kardia mobile device versus Apple Watch today as well so she can monitor at home.  ROS: ***  Studies Reviewed      *** Risk Assessment/Calculations {Does this patient have ATRIAL FIBRILLATION?:[phone]}         Physical Exam VS:  BP 100/80   Pulse 72   Ht 5' 9 (1.753 m)   Wt 181 lb (82.1 kg)   SpO2 98%   BMI 26.73 kg/m        Wt Readings from Last 3 Encounters:  12/31/24  181 lb (82.1 kg)  12/30/24 180 lb 3.2 oz (81.7 kg)  10/03/20 206 lb 1.6 oz (93.5 kg)    GEN: Well nourished, well developed in no acute distress NECK: No JVD; No carotid bruits CARDIAC: ***RRR, no murmurs, rubs, gallops RESPIRATORY:  Clear to auscultation without rales, wheezing or rhonchi  ABDOMEN: Soft, non-tender, non-distended EXTREMITIES:  No edema; No deformity   ASSESSMENT AND PLAN ***    {Are you ordering a CV Procedure (e.g. stress test, cath, DCCV, TEE, etc)?   Press F2        :[id number]}  Dispo: ***  Signed, Scot Ford, PA  "

## 2025-01-01 ENCOUNTER — Ambulatory Visit: Payer: Self-pay | Admitting: Family Medicine

## 2025-01-01 DIAGNOSIS — Z862 Personal history of diseases of the blood and blood-forming organs and certain disorders involving the immune mechanism: Secondary | ICD-10-CM | POA: Insufficient documentation

## 2025-01-01 DIAGNOSIS — R002 Palpitations: Secondary | ICD-10-CM | POA: Insufficient documentation

## 2025-01-01 DIAGNOSIS — E559 Vitamin D deficiency, unspecified: Secondary | ICD-10-CM

## 2025-01-01 MED ORDER — VITAMIN D (ERGOCALCIFEROL) 1.25 MG (50000 UNIT) PO CAPS: 50000.0000 [IU] | ORAL_CAPSULE | ORAL | 0 refills | Status: AC

## 2025-01-01 NOTE — Assessment & Plan Note (Signed)
 History of iron deficiency anemia Previous low hemoglobin likely due to pregnancy-related blood loss. - Ordered blood count to assess current hemoglobin levels.

## 2025-01-01 NOTE — Assessment & Plan Note (Addendum)
 Heart palpitations Intermittent palpitations since 2021, possibly linked to hormone medication. No family history of heart disease. Differential includes thyroid dysfunction or electrolyte imbalance. - Referred to cardiology for further evaluation. - Ordered EKG, NSR w/ RBBB. No ST elevation or depression  - Ordered thyroid function tests. - Ordered vitamin D , B12, magnesium , and potassium tests.

## 2025-01-03 ENCOUNTER — Other Ambulatory Visit: Payer: Self-pay | Admitting: Physician Assistant

## 2025-01-03 DIAGNOSIS — I4510 Unspecified right bundle-branch block: Secondary | ICD-10-CM

## 2025-01-03 DIAGNOSIS — R002 Palpitations: Secondary | ICD-10-CM

## 2025-01-27 ENCOUNTER — Encounter: Admitting: Family Medicine

## 2025-01-27 ENCOUNTER — Ambulatory Visit (HOSPITAL_BASED_OUTPATIENT_CLINIC_OR_DEPARTMENT_OTHER)

## 2025-04-07 ENCOUNTER — Ambulatory Visit: Admitting: Physician Assistant
